# Patient Record
Sex: Male | Born: 1970
Health system: Southern US, Community
[De-identification: ages and names within clinical notes are randomized; demographics above are authoritative.]

## PROBLEM LIST (undated history)

## (undated) DIAGNOSIS — K219 Gastro-esophageal reflux disease without esophagitis: Secondary | ICD-10-CM

## (undated) DIAGNOSIS — IMO0002 Reserved for concepts with insufficient information to code with codable children: Secondary | ICD-10-CM

## (undated) DIAGNOSIS — G8929 Other chronic pain: Secondary | ICD-10-CM

## (undated) DIAGNOSIS — K921 Melena: Secondary | ICD-10-CM

## (undated) DIAGNOSIS — K279 Peptic ulcer, site unspecified, unspecified as acute or chronic, without hemorrhage or perforation: Secondary | ICD-10-CM

## (undated) DIAGNOSIS — M5090 Cervical disc disorder, unspecified, unspecified cervical region: Secondary | ICD-10-CM

## (undated) HISTORY — PX: HAND SURGERY: SHX662

## (undated) HISTORY — PX: WRIST SURGERY: SHX841

## (undated) HISTORY — DX: Cervical disc disorder, unspecified, unspecified cervical region: M50.90

## (undated) HISTORY — DX: Reserved for concepts with insufficient information to code with codable children: IMO0002

---

## 1998-10-29 HISTORY — PX: CERVICAL SPINE SURGERY: SHX589

## 1998-10-29 HISTORY — PX: NECK SURGERY: SHX720

## 1999-01-20 ENCOUNTER — Encounter: Payer: Self-pay | Admitting: *Deleted

## 1999-01-20 ENCOUNTER — Ambulatory Visit (HOSPITAL_COMMUNITY): Admission: RE | Admit: 1999-01-20 | Discharge: 1999-01-20 | Payer: Self-pay | Admitting: *Deleted

## 2000-07-09 ENCOUNTER — Encounter: Payer: Self-pay | Admitting: Neurosurgery

## 2000-07-09 ENCOUNTER — Ambulatory Visit (HOSPITAL_COMMUNITY): Admission: RE | Admit: 2000-07-09 | Discharge: 2000-07-09 | Payer: Self-pay | Admitting: Neurosurgery

## 2000-11-25 ENCOUNTER — Encounter: Payer: Self-pay | Admitting: Neurosurgery

## 2000-11-27 ENCOUNTER — Encounter: Payer: Self-pay | Admitting: Neurosurgery

## 2000-11-27 ENCOUNTER — Inpatient Hospital Stay (HOSPITAL_COMMUNITY): Admission: RE | Admit: 2000-11-27 | Discharge: 2000-11-28 | Payer: Self-pay | Admitting: Neurosurgery

## 2000-12-13 ENCOUNTER — Encounter: Payer: Self-pay | Admitting: Neurosurgery

## 2000-12-13 ENCOUNTER — Encounter: Admission: RE | Admit: 2000-12-13 | Discharge: 2000-12-13 | Payer: Self-pay | Admitting: Neurosurgery

## 2001-04-07 ENCOUNTER — Ambulatory Visit (HOSPITAL_COMMUNITY): Admission: RE | Admit: 2001-04-07 | Discharge: 2001-04-07 | Payer: Self-pay | Admitting: Neurosurgery

## 2001-04-07 ENCOUNTER — Encounter: Payer: Self-pay | Admitting: Neurosurgery

## 2002-12-13 ENCOUNTER — Encounter: Payer: Self-pay | Admitting: Neurosurgery

## 2002-12-13 ENCOUNTER — Ambulatory Visit (HOSPITAL_COMMUNITY): Admission: RE | Admit: 2002-12-13 | Discharge: 2002-12-13 | Payer: Self-pay | Admitting: Neurosurgery

## 2003-01-06 ENCOUNTER — Encounter: Payer: Self-pay | Admitting: Neurosurgery

## 2003-01-06 ENCOUNTER — Ambulatory Visit (HOSPITAL_COMMUNITY): Admission: RE | Admit: 2003-01-06 | Discharge: 2003-01-06 | Payer: Self-pay | Admitting: Neurosurgery

## 2003-01-15 ENCOUNTER — Encounter: Payer: Self-pay | Admitting: Neurosurgery

## 2003-01-15 ENCOUNTER — Inpatient Hospital Stay (HOSPITAL_COMMUNITY): Admission: RE | Admit: 2003-01-15 | Discharge: 2003-01-16 | Payer: Self-pay | Admitting: Neurosurgery

## 2003-04-26 ENCOUNTER — Encounter: Admission: RE | Admit: 2003-04-26 | Discharge: 2003-04-26 | Payer: Self-pay | Admitting: Family Medicine

## 2004-11-15 ENCOUNTER — Ambulatory Visit: Payer: Self-pay | Admitting: Family Medicine

## 2005-03-18 ENCOUNTER — Emergency Department (HOSPITAL_COMMUNITY): Admission: EM | Admit: 2005-03-18 | Discharge: 2005-03-18 | Payer: Self-pay | Admitting: Emergency Medicine

## 2005-03-30 ENCOUNTER — Emergency Department (HOSPITAL_COMMUNITY): Admission: EM | Admit: 2005-03-30 | Discharge: 2005-03-30 | Payer: Self-pay | Admitting: Emergency Medicine

## 2005-07-31 ENCOUNTER — Ambulatory Visit: Payer: Self-pay | Admitting: Family Medicine

## 2005-08-02 ENCOUNTER — Ambulatory Visit: Payer: Self-pay | Admitting: Family Medicine

## 2005-08-30 ENCOUNTER — Ambulatory Visit: Payer: Self-pay | Admitting: Family Medicine

## 2005-10-29 HISTORY — PX: CHOLECYSTECTOMY: SHX55

## 2006-09-12 ENCOUNTER — Ambulatory Visit: Payer: Self-pay | Admitting: Family Medicine

## 2006-10-29 DIAGNOSIS — IMO0002 Reserved for concepts with insufficient information to code with codable children: Secondary | ICD-10-CM

## 2006-10-29 DIAGNOSIS — K279 Peptic ulcer, site unspecified, unspecified as acute or chronic, without hemorrhage or perforation: Secondary | ICD-10-CM

## 2006-10-29 HISTORY — DX: Peptic ulcer, site unspecified, unspecified as acute or chronic, without hemorrhage or perforation: K27.9

## 2006-10-29 HISTORY — DX: Reserved for concepts with insufficient information to code with codable children: IMO0002

## 2006-10-30 ENCOUNTER — Ambulatory Visit: Payer: Self-pay | Admitting: Family Medicine

## 2006-12-26 DIAGNOSIS — F329 Major depressive disorder, single episode, unspecified: Secondary | ICD-10-CM

## 2007-01-07 ENCOUNTER — Ambulatory Visit: Payer: Self-pay | Admitting: Internal Medicine

## 2007-01-09 ENCOUNTER — Ambulatory Visit (HOSPITAL_COMMUNITY): Admission: RE | Admit: 2007-01-09 | Discharge: 2007-01-09 | Payer: Self-pay | Admitting: Internal Medicine

## 2007-01-10 ENCOUNTER — Ambulatory Visit: Payer: Self-pay | Admitting: *Deleted

## 2007-02-11 ENCOUNTER — Ambulatory Visit: Payer: Self-pay | Admitting: Internal Medicine

## 2007-08-01 DIAGNOSIS — R1011 Right upper quadrant pain: Secondary | ICD-10-CM

## 2007-08-01 DIAGNOSIS — F17200 Nicotine dependence, unspecified, uncomplicated: Secondary | ICD-10-CM | POA: Insufficient documentation

## 2007-08-01 DIAGNOSIS — M542 Cervicalgia: Secondary | ICD-10-CM

## 2007-08-01 DIAGNOSIS — M79609 Pain in unspecified limb: Secondary | ICD-10-CM

## 2007-08-01 DIAGNOSIS — K279 Peptic ulcer, site unspecified, unspecified as acute or chronic, without hemorrhage or perforation: Secondary | ICD-10-CM | POA: Insufficient documentation

## 2007-08-12 ENCOUNTER — Ambulatory Visit: Payer: Self-pay | Admitting: Internal Medicine

## 2007-08-15 ENCOUNTER — Ambulatory Visit: Payer: Self-pay | Admitting: Internal Medicine

## 2007-09-02 ENCOUNTER — Encounter: Admission: RE | Admit: 2007-09-02 | Discharge: 2007-09-02 | Payer: Self-pay | Admitting: General Surgery

## 2007-09-18 ENCOUNTER — Encounter (INDEPENDENT_AMBULATORY_CARE_PROVIDER_SITE_OTHER): Payer: Self-pay | Admitting: General Surgery

## 2007-09-18 ENCOUNTER — Ambulatory Visit (HOSPITAL_COMMUNITY): Admission: RE | Admit: 2007-09-18 | Discharge: 2007-09-18 | Payer: Self-pay | Admitting: General Surgery

## 2007-11-28 ENCOUNTER — Telehealth (INDEPENDENT_AMBULATORY_CARE_PROVIDER_SITE_OTHER): Payer: Self-pay | Admitting: Internal Medicine

## 2008-01-08 ENCOUNTER — Ambulatory Visit: Payer: Self-pay | Admitting: Internal Medicine

## 2008-02-10 ENCOUNTER — Telehealth (INDEPENDENT_AMBULATORY_CARE_PROVIDER_SITE_OTHER): Payer: Self-pay | Admitting: Internal Medicine

## 2008-03-08 ENCOUNTER — Telehealth (INDEPENDENT_AMBULATORY_CARE_PROVIDER_SITE_OTHER): Payer: Self-pay | Admitting: Internal Medicine

## 2008-03-16 ENCOUNTER — Ambulatory Visit: Payer: Self-pay | Admitting: Internal Medicine

## 2008-03-16 DIAGNOSIS — M25519 Pain in unspecified shoulder: Secondary | ICD-10-CM | POA: Insufficient documentation

## 2008-03-31 ENCOUNTER — Encounter: Admission: RE | Admit: 2008-03-31 | Discharge: 2008-05-03 | Payer: Self-pay | Admitting: Internal Medicine

## 2008-03-31 ENCOUNTER — Encounter (INDEPENDENT_AMBULATORY_CARE_PROVIDER_SITE_OTHER): Payer: Self-pay | Admitting: Internal Medicine

## 2008-04-06 ENCOUNTER — Ambulatory Visit (HOSPITAL_COMMUNITY): Admission: RE | Admit: 2008-04-06 | Discharge: 2008-04-06 | Payer: Self-pay | Admitting: Internal Medicine

## 2008-04-29 ENCOUNTER — Encounter (INDEPENDENT_AMBULATORY_CARE_PROVIDER_SITE_OTHER): Payer: Self-pay | Admitting: Internal Medicine

## 2008-05-27 ENCOUNTER — Ambulatory Visit: Payer: Self-pay | Admitting: Internal Medicine

## 2008-09-06 ENCOUNTER — Telehealth (INDEPENDENT_AMBULATORY_CARE_PROVIDER_SITE_OTHER): Payer: Self-pay | Admitting: Family Medicine

## 2008-09-07 ENCOUNTER — Ambulatory Visit: Payer: Self-pay | Admitting: Family Medicine

## 2008-09-07 DIAGNOSIS — S2341XA Sprain of ribs, initial encounter: Secondary | ICD-10-CM

## 2008-09-21 ENCOUNTER — Telehealth (INDEPENDENT_AMBULATORY_CARE_PROVIDER_SITE_OTHER): Payer: Self-pay | Admitting: Internal Medicine

## 2008-12-24 ENCOUNTER — Telehealth (INDEPENDENT_AMBULATORY_CARE_PROVIDER_SITE_OTHER): Payer: Self-pay | Admitting: Internal Medicine

## 2009-01-21 ENCOUNTER — Telehealth (INDEPENDENT_AMBULATORY_CARE_PROVIDER_SITE_OTHER): Payer: Self-pay | Admitting: Internal Medicine

## 2009-07-01 ENCOUNTER — Telehealth (INDEPENDENT_AMBULATORY_CARE_PROVIDER_SITE_OTHER): Payer: Self-pay | Admitting: Internal Medicine

## 2009-07-28 ENCOUNTER — Ambulatory Visit: Payer: Self-pay | Admitting: Internal Medicine

## 2009-10-07 ENCOUNTER — Telehealth (INDEPENDENT_AMBULATORY_CARE_PROVIDER_SITE_OTHER): Payer: Self-pay | Admitting: Internal Medicine

## 2009-10-11 ENCOUNTER — Telehealth (INDEPENDENT_AMBULATORY_CARE_PROVIDER_SITE_OTHER): Payer: Self-pay | Admitting: Internal Medicine

## 2009-11-17 ENCOUNTER — Ambulatory Visit: Payer: Self-pay | Admitting: Internal Medicine

## 2010-01-19 ENCOUNTER — Ambulatory Visit: Payer: Self-pay | Admitting: Internal Medicine

## 2010-04-19 ENCOUNTER — Telehealth (INDEPENDENT_AMBULATORY_CARE_PROVIDER_SITE_OTHER): Payer: Self-pay | Admitting: Internal Medicine

## 2010-05-10 ENCOUNTER — Telehealth (INDEPENDENT_AMBULATORY_CARE_PROVIDER_SITE_OTHER): Payer: Self-pay | Admitting: Internal Medicine

## 2010-07-18 ENCOUNTER — Ambulatory Visit: Payer: Self-pay | Admitting: Internal Medicine

## 2010-07-18 LAB — CONVERTED CEMR LAB
Bilirubin Urine: NEGATIVE
Glucose, Urine, Semiquant: NEGATIVE
Protein, U semiquant: NEGATIVE
Specific Gravity, Urine: <1.005

## 2010-08-01 ENCOUNTER — Telehealth (INDEPENDENT_AMBULATORY_CARE_PROVIDER_SITE_OTHER): Payer: Self-pay | Admitting: Internal Medicine

## 2010-08-01 ENCOUNTER — Ambulatory Visit: Payer: Self-pay | Admitting: Internal Medicine

## 2010-08-01 LAB — CONVERTED CEMR LAB
ALT: 28 units/L (ref 0–53)
AST: 20 units/L (ref 0–37)
Alkaline Phosphatase: 69 units/L (ref 39–117)
Basophils Relative: 1 % (ref 0–1)
Calcium: 9.7 mg/dL (ref 8.4–10.5)
Cholesterol: 155 mg/dL (ref 0–200)
Creatinine, Ser: 1.09 mg/dL (ref 0.40–1.50)
HCT: 47 % (ref 39.0–52.0)
Hemoglobin: 15.4 g/dL (ref 13.0–17.0)
MCHC: 32.8 g/dL (ref 30.0–36.0)
MCV: 91.8 fL (ref 78.0–100.0)
Neutro Abs: 3 10*3/uL (ref 1.7–7.7)
Neutrophils Relative %: 55 % (ref 43–77)
Total Bilirubin: 0.9 mg/dL (ref 0.3–1.2)
VLDL: 21 mg/dL (ref 0–40)
WBC: 5.4 10*3/uL (ref 4.0–10.5)

## 2010-08-11 ENCOUNTER — Telehealth (INDEPENDENT_AMBULATORY_CARE_PROVIDER_SITE_OTHER): Payer: Self-pay | Admitting: Internal Medicine

## 2010-08-25 ENCOUNTER — Encounter (INDEPENDENT_AMBULATORY_CARE_PROVIDER_SITE_OTHER): Payer: Self-pay | Admitting: Internal Medicine

## 2010-11-09 ENCOUNTER — Telehealth (INDEPENDENT_AMBULATORY_CARE_PROVIDER_SITE_OTHER): Payer: Self-pay | Admitting: Internal Medicine

## 2010-11-14 ENCOUNTER — Telehealth (INDEPENDENT_AMBULATORY_CARE_PROVIDER_SITE_OTHER): Payer: Self-pay | Admitting: Internal Medicine

## 2010-11-28 NOTE — Progress Notes (Signed)
Summary: Vicodin refill  Phone Note Call from Patient Call back at Home Phone [phone]   Reason for Call: Refill Medication Summary of Call: Zachary Berg PT. Zachary Berg CALLED AND SAYS THAT [facility]CE[facility] REFILL REQUEST ON HIS VICODEN. Initial call taken by: Leodis Rains,  April 19, 2010 9:27 AM  Follow-up for Phone Call        He last got #60 on 03/13/10. Follow-up by: Vesta Mixer CMA,  April 19, 2010 12:52 PM  Additional Follow-up for Phone Call Additional follow up Details #1::        Let him know faxed tomorrow Additional Follow-up by: Julieanne Manson MD,  April 19, 2010 7:09 PM    Additional Follow-up for Phone Call Additional follow up Details #2::    No current number for pt. Follow-up by: Vesta Mixer CMA,  April 20, 2010 3:21 PM  Prescriptions: VICODIN 5-500 MG TABS (HYDROCODONE-ACETAMINOPHEN) One tablet by mouth every 6 hours as needed for pain  #60 x 0   Entered and Authorized by:   Julieanne Manson MD   Signed by:   Julieanne Manson MD on 04/19/2010   Method used:   Printed then faxed to ...       [facility]  [address] [phone][facility]ail[facility]       [address]       [place], [place]  [address]       Ph: [phone] or [phone]       Fax: [phone]   RxID:   [id number]

## 2010-11-28 NOTE — Letter (Signed)
Summary: AGREEMENT FOR CONTROLLED SUBSTANCE  AGREEMENT FOR CONTROLLED SUBSTANCE   Imported By: Arta Bruce 07/18/2010 15:07:11  _____________________________________________________________________  External Attachment:    Type:   Image     Comment:   External Document

## 2010-11-28 NOTE — Assessment & Plan Note (Signed)
Summary: 2 MONTH FOLLOWUP///BC   Vital Signs:  Patient profile:   40 year old male Weight:      146.56 pounds Temp:     97.5 degrees F Pulse rate:   59 / minute Pulse rhythm:   regular Resp:     16 per minute BP sitting:   134 / 79  (left arm) Cuff size:   regular  Vitals Entered By: Chauncy Passy, SMA CC: Pt. is here for a 2 mont f/u on cessation of smoking. Per pt., it is going well and has not smoked since last visit.  Is Patient Diabetic? No Pain Assessment Patient in pain? no       Does patient need assistance? Ambulation Normal   CC:  Pt. is here for a 2 mont f/u on cessation of smoking. Per pt. and it is going well and has not smoked since last visit. Marland Kitchen  History of Present Illness: 1.  Tobacco Abuse:  On last 4 weeks of 12 week course of Chantix.  No longer smoking.  Wife has quit as well.  Feels well.  Can taste food better.  Feels he is breathing better.  No cough now.  Is having cravings for nicotine now--maybe more than when first stopped.  Trying to keep busy with physical activity.    Current Medications (verified): 1)  Vicodin 5-500 Mg Tabs (Hydrocodone-Acetaminophen) .... One Tablet By Mouth Every 6 Hours As Needed For Pain 2)  Prilosec 40 Mg  Cpdr (Omeprazole) .Marland Kitchen.. 1 By Mouth Two Times A Day As Needed 3)  Chantix Starting Month Pak 0.5 Mg X 11 & 1 Mg X 42 Tabs (Varenicline Tartrate) .... 0.5 Mg Tab By Mouth Daily For 3 Days, Then Increase To Two Times A Day For 4 Days, Then Increase To 1 Mg Tab By Mouth Two Times A Day Thereafter.  Stop Smoking On Day #8 4)  Chantix 1 Mg Tabs (Varenicline Tartrate) .Marland Kitchen.. 1 Tab By Mouth Two Times A Day  Allergies (verified): No Known Drug Allergies  Physical Exam  Lungs:  Normal respiratory effort, chest expands symmetrically. Lungs are clear to auscultation, no crackles or wheezes. Heart:  Normal rate and regular rhythm. S1 and S2 normal without gallop, murmur, click, rub or other extra sounds.   Impression &  Recommendations:  Problem # 1:  TOBACCO ABUSE (ICD-305.1) Doing well--will extend out use of Chantix with current cravings--may wean or stop as he feels he can in next 3 months His updated medication list for this problem includes:    Chantix Starting Month Pak 0.5 Mg X 11 & 1 Mg X 42 Tabs (Varenicline tartrate) .Marland Kitchen... 0.5 mg tab by mouth daily for 3 days, then increase to two times a day for 4 days, then increase to 1 mg tab by mouth two times a day thereafter.  stop smoking on day #8    Chantix 1 Mg Tabs (Varenicline tartrate) .Marland Kitchen... 1 tab by mouth two times a day  Complete Medication List: 1)  Vicodin 5-500 Mg Tabs (Hydrocodone-acetaminophen) .... One tablet by mouth every 6 hours as needed for pain 2)  Prilosec 40 Mg Cpdr (Omeprazole) .Marland Kitchen.. 1 by mouth two times a day as needed 3)  Chantix Starting Month Pak 0.5 Mg X 11 & 1 Mg X 42 Tabs (Varenicline tartrate) .... 0.5 mg tab by mouth daily for 3 days, then increase to two times a day for 4 days, then increase to 1 mg tab by mouth two times a day thereafter.  stop smoking on day #8 4)  Chantix 1 Mg Tabs (Varenicline tartrate) .Marland Kitchen.. 1 tab by mouth two times a day  Patient Instructions: 1)  CPE in next 6 months with Dr. Delrae Alfred Prescriptions: CHANTIX 1 MG TABS (VARENICLINE TARTRATE) 1 tab by mouth two times a day  #60 x 2   Entered and Authorized by:   Julieanne Manson MD   Signed by:   Julieanne Manson MD on 01/19/2010   Method used:   Faxed to ...       [facility] (retail)       [address] [place], [place]  [address]       Ph: [phone]       Fax: [phone]   RxID:   [id number]

## 2010-11-28 NOTE — Progress Notes (Signed)
Summary: REFILL ON HYDROCODONE  Phone Note Call from Patient Call back at Home Phone [phone]   Reason for Call: Refill Medication Summary of Call: Kristoff Coonradt PT. [facility] Nicholes [facility]ED [facility]   [place] [facility]FI[facility] ON HIS HYDROCODONE. [facility]ES [facility] [place]. Initial call taken by: Leodis Rains,  August 11, 2010 3:48 PM  Follow-up for Phone Call        Due today--called into pharmacy Follow-up by: Julieanne Manson MD,  August 15, 2010 8:34 AM    Prescriptions: VICODIN 5-500 MG TABS (HYDROCODONE-ACETAMINOPHEN) One tablet by mouth every 6 hours as needed for pain  #60 x 0   Entered by:   Julieanne Manson MD   Authorized by:   Dutch Quint RN   Signed by:   Julieanne Manson MD on 08/15/2010   Method used:   Telephoned to ...       [facility]  [address] [phone]* (retail[facility]       [address]       [place], [place]  [address]       Ph: [phone] or [phone]       Fax: [phone]   RxID:   [id number]

## 2010-11-28 NOTE — Progress Notes (Signed)
Summary: REFILL ON VICODEN  Phone Note Call from Patient Call back at Home Phone [phone]   Reason for Call: Refill Medication Summary of Call: Leiliana Foody PT. MR Zacher CALLED AND SAYS THAT HE NEEDS ANOTHER REFILL ON HIS VICODEN. [facility] [place]. HE IS LEAVING GO[place]. Initial call taken by: Leodis Rains,  May 10, 2010 5:00PM  Follow-up for Phone Call        Received fax from pharmacy pt last filled 04/20/2010 The earliest pt can get medication is 05/19/2010 (next Friday) Follow-up by: Lehman Prom FNP,  May 11, 2010 8:09 AM  Additional Follow-up for Phone Call Additional follow up Details #1::        Last month's refill was late--will fill today Additional Follow-up by: Julieanne Manson MD,  May 12, 2010 8:23 AM    Prescriptions: VICODIN 5-500 MG TABS (HYDROCODONE-ACETAMINOPHEN) One tablet by mouth every 6 hours as needed for pain  #60 x 0   Entered by:   Julieanne Manson MD   Authorized by:   Vesta Mixer CMA   Signed by:   Julieanne Manson MD on 05/12/2010   Method used:   Printed then faxed to ...       [facility]  [address] [phone][facility] ([facility]ail)       [address] [place]  [address]       Ph: [phone] or [phone]       Fax: [phone]   RxID:   [id number]   Appended Document: REFILL ON VICODEN    Clinical Lists Changes  Medications: Rx of VICODIN 5-500 MG TABS (HYDROCODONE-ACETAMINOPHEN) One tablet by mouth every 6 hours as needed for pain;  #60 x 0;  Signed;  Entered by: Julieanne Manson MD;  Authorized by: Julieanne Manson MD;  Method used: [facility]  [address] [phone] [address] [place], [place], [place]  [address], Ph: [phone] or [phone], Fax: [phone]    Prescriptions: VICODIN 5-500 MG TABS (HYDROCODONE-ACETAMINOPHEN) One tablet by mouth every 6 hours as needed for pain  #60 x 0   Entered and Authorized by:   Julieanne Manson MD   Signed by:    Julieanne Manson MD on 05/12/2010   Method used:   Printed then faxed to ...       [facility]  [address] [phone][facility] ([facility])       [address]       [place], [place]  [address]       Ph: [phone] or [phone]       Fax: [phone]   RxID:   [id number]  WRong name on Rx--reprinted

## 2010-11-28 NOTE — Progress Notes (Signed)
Summary: Office Visit//depression screening  Office Visit//depression screening   Imported By: Arta Bruce 07/18/2010 12:35:23  _____________________________________________________________________  External Attachment:    Type:   Image     Comment:   External Document
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
## 2010-11-28 NOTE — Letter (Signed)
Summary: Lipid Letter  [facility]  [address] [place], [place] [address]   Phone: [phone]  Fax: [phone]    08/25/2010  Zachary Berg [address] [place], [place]  [address]  Dear [name]:  We have carefully reviewed your last lipid profile from 08/01/2010 and the results are noted below with a summary of recommendations for lipid management.    Cholesterol:       155     Goal: <200   HDL "good" Cholesterol:   56     Goal: >45   LDL "bad" Cholesterol:   78     Goal: <100   Triglycerides:       107     Goal: <150    Cholesterol  and rest of labs excellent.    TLC Diet (Therapeutic Lifestyle Change): Saturated Fats & Transfatty acids should be kept < 7% of total calories ***Reduce Saturated Fats Polyunstaurated Fat can be up to 10% of total calories Monounsaturated Fat Fat can be up to 20% of total calories Total Fat should be no greater than 25-35% of total calories Carbohydrates should be 50-60% of total calories Protein should be approximately 15% of total calories Fiber should be at least 20-30 grams a day ***Increased fiber may help lower LDL Total Cholesterol should be < 200mg /day Consider adding plant stanol/sterols to diet (example: Benacol spread) ***A higher intake of unsaturated fat may reduce Triglycerides and Increase HDL    Adjunctive Measures (may lower LIPIDS and reduce risk of Heart Attack) include: Aerobic Exercise (20-30 minutes 3-4 times a week) Limit Alcohol Consumption Weight Reduction Aspirin 75-81 mg a day by mouth (if not allergic or contraindicated) Dietary Fiber 20-30 grams a day by mouth     Current Medications: 1)    Vicodin 5-500 Mg Tabs (Hydrocodone-acetaminophen) .... One tablet by mouth every 6 hours as needed for pain 2)    Prilosec 40 Mg  Cpdr (Omeprazole) .Marland Kitchen.. 1 by mouth two times a day as needed  If you have any questions, please call. We appreciate being able to work with you.   Sincerely,     [facility] Julieanne Manson MD

## 2010-11-28 NOTE — Assessment & Plan Note (Signed)
Summary: 3 MONTH FU FROM SEPT//KT   Vital Signs:  Patient profile:   40 year old male Weight:      141.8 pounds Temp:     97.6 degrees F oral Pulse rate:   59 / minute Pulse rhythm:   regular Resp:     18 per minute BP sitting:   127 / 84  (left arm) Cuff size:   regular  Vitals Entered By: Geanie Cooley (November 17, 2009 12:22 PM) CC: pt here for followup from sept. Is Patient Diabetic? No Pain Assessment Patient in pain? no       Does patient need assistance? Functional Status Self care Ambulation Normal   CC:  pt here for followup from sept.Marland Kitchen  History of Present Illness: 1.  Smoking cessation:  Finally started Chantix 1 1/2 weeks ago.  Has not stopped smoking as directed on day 8.  Tolerating well so far.  2.  Chronic Neck Pain:  still requring narcotics, but has not needed increase over some time.  No change in his symptoms with tingling in hands bilaterally  3.  Right groin pain:  infrequent.  Has not enlarged.  States surgery said it was not a hernia when seen in 2009, but to call if lump gets bigger.  Allergies (verified): No Known Drug Allergies  Physical Exam  Lungs:  Normal respiratory effort, chest expands symmetrically. Lungs are clear to auscultation, no crackles or wheezes. Heart:  Normal rate and regular rhythm. S1 and S2 normal without gallop, murmur, click, rub or other extra sounds.   Impression & Recommendations:  Problem # 1:  TOBACCO ABUSE (ICD-305.1) Just getting started Needs to stop smoking now--discussed he needs to be motivated, the pill will not make him stop. To call if any problems with mood. His updated medication list for this problem includes:    Chantix Starting Month Pak 0.5 Mg X 11 & 1 Mg X 42 Tabs (Varenicline tartrate) .Marland Kitchen... 0.5 mg tab by mouth daily for 3 days, then increase to two times a day for 4 days, then increase to 1 mg tab by mouth two times a day thereafter.  stop smoking on day #8    Chantix 1 Mg Tabs  (Varenicline tartrate) .Marland Kitchen... 1 tab by mouth two times a day  Problem # 2:  NECK PAIN, CHRONIC (ICD-723.1)  His updated medication list for this problem includes:    Vicodin 5-500 Mg Tabs (Hydrocodone-acetaminophen) .Marland Kitchen... 1 tablet by mouth every six hours  Problem # 3:  POSSIBLE RIGHT INGUINAL HERNIA (ICD-550.90) Need to get surgical papers  Complete Medication List: 1)  Vicodin 5-500 Mg Tabs (Hydrocodone-acetaminophen) .Marland Kitchen.. 1 tablet by mouth every six hours 2)  Prilosec 40 Mg Cpdr (Omeprazole) .Marland Kitchen.. 1 by mouth two times a day as needed 3)  Chantix Starting Month Pak 0.5 Mg X 11 & 1 Mg X 42 Tabs (Varenicline tartrate) .... 0.5 mg tab by mouth daily for 3 days, then increase to two times a day for 4 days, then increase to 1 mg tab by mouth two times a day thereafter.  stop smoking on day #8 4)  Chantix 1 Mg Tabs (Varenicline tartrate) .Marland Kitchen.. 1 tab by mouth two times a day  Patient Instructions: 1)  Follow up with Dr. Delrae Alfred in 2 months --smoking cessation.   Vital Signs:  Patient profile:   40 year old male Weight:      141.8 pounds Temp:     97.6 degrees F oral Pulse rate:  59 / minute Pulse rhythm:   regular Resp:     18 per minute BP sitting:   127 / 84  (left arm) Cuff size:   regular  Vitals Entered By: Geanie Cooley (November 17, 2009 12:22 PM)

## 2010-11-30 NOTE — Progress Notes (Signed)
Summary: Needs vaccines prior to traveling  Phone Note Call from Patient   Summary of Call: pt is going to [place] on a mission trip in March and he knows he needs tetanus shot but he does not know the other shots he might need and he is wondering if you are able to tell him.... pt would like to know if he can get a tetanus shot here Initial call taken by: Armenia Shannon,  November 09, 2010 10:15 AM  Follow-up for Phone Call        Per immunization record, last received Td 2003.  Dutch Quint RN  November 09, 2010 10:32 AM   Additional Follow-up for Phone Call Additional follow up Details #1::        I can look it up, but he can as well.  I would actually encourage him to call the PHD and make an appt. as they will be the only ones able to give any of the required vaccines. If he gets on the [facility] website for travel and immunization, he can check the areas of the country he will be going to and see if there are recommendations for vaccines. Additional Follow-up by: Julieanne Manson MD,  November 14, 2010 2:34 PM    Additional Follow-up for Phone Call Additional follow up Details #2::    Pt. advised of provider's response and recommendations - verbalized understanding and agreement.  Dutch Quint RN  November 14, 2010 2:51 PM

## 2010-11-30 NOTE — Progress Notes (Signed)
Summary: Vicodin  Phone Note Refill Request   Refills Requested: Medication #1:  VICODIN 5-500 MG TABS One tablet by mouth every 6 hours as needed for pain [facility] [address]  Initial call taken by: Armenia Shannon,  November 14, 2010 2:49 PM    Prescriptions: VICODIN 5-500 MG TABS (HYDROCODONE-ACETAMINOPHEN) One tablet by mouth every 6 hours as needed for pain  #60 x 0   Entered and Authorized by:   Julieanne Manson MD   Signed by:   Julieanne Manson MD on 11/15/2010   Method used:   Printed then faxed to ...       [facility]  [address] [phone][facility] (retail)       [address]       [place], [place]  [address]       Ph: [phone] or [phone]       Fax: [phone]   RxID:   [id number]

## 2010-12-14 ENCOUNTER — Telehealth (INDEPENDENT_AMBULATORY_CARE_PROVIDER_SITE_OTHER): Payer: Self-pay | Admitting: Internal Medicine

## 2011-01-09 NOTE — Progress Notes (Signed)
Summary: HYDROCODONE  Phone Note Call from Patient Call back at Home Phone [phone]   Reason for Call: Refill Medication Summary of Call: Layah Skousen PT. MR Sima SAYS THAT HE CALLED [facility] [address] FOR [facility] REFILL REQUEST ON HIS HYDROCODONE, BUT SO FAR WE HAVEN'T RECEIVED THE REQUEST. Initial call taken by: Leodis Rains,  December 14, 2010 11:14 AM  Follow-up for Phone Call        received and refilled advise pt to check with the pharmacy Follow-up by: Lehman Prom FNP,  December 18, 2010 9:02 AM  Additional Follow-up for Phone Call Additional follow up Details #1::        Per pharmacy, pt. has already picked up Rx.  Dutch Quint RN  January 01, 2011 8:59 AM     New/Updated Medications: VICODIN 5-500 MG TABS (HYDROCODONE-ACETAMINOPHEN) One tablet by mouth two times a day as needed for pain Prescriptions: VICODIN 5-500 MG TABS (HYDROCODONE-ACETAMINOPHEN) One tablet by mouth two times a day as needed for pain  #60 x 0   Entered and Authorized by:   Lehman Prom FNP   Signed by:   Lehman Prom FNP on 12/18/2010   Method used:   Printed then faxed to ...       [facility]  [address] [phone][facility] (retail)       [address]       [place], [place]  [address]       Ph: [phone] or [phone]       Fax: [phone]   RxID:   [id number]
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
## 2011-03-16 NOTE — H&P (Signed)
NAME:  WARREN, LINDAHL                            ACCOUNT NO.:  [id number]   MEDICAL RECORD NO.:  [id number]                   PATIENT TYPE:  INP   LOCATION:  3035                                 FACILITY:  [facility]   PHYSICIAN:  Payton Doughty, M.D.                   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   HISTORY OF PRESENT ILLNESS:  This is a now 40 year old right-handed white  gentleman who in 2002 underwent an anterior cervical diskectomy and fusion  at C5-6.  Postoperatively, he did reasonably well.  He has developed  increasing pain in his neck and now in his arm.  Plain films demonstrate a  nonunion at C5-6 and he is admitted for stabilization posteriorly.   PAST MEDICAL HISTORY:  Unremarkable for significant diseases.  He has had a  tendon injury in the left wrist as a child.   PAST SURGICAL HISTORY:  1. Tonsillectomy.  2. Anterior cervical.   ALLERGIES:  None.   MEDICATIONS:  Darvocet on a p.r.n. basis.   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   FAMILY HISTORY:  Mom is 74 and in good health, dad is 71 in fair health with  heart disease.   REVIEW OF SYSTEMS:  Remarkable for arm weakness, leg weakness, back pain,  arm pain, joint pain and swelling, and neck pain.   PHYSICAL EXAMINATION:  HEENT:  Within normal limits.  NECK:  He does not have great range of motion in his neck, particularly when  it pops and cracks in towards turning to the right then to the left.  CHEST:  Some crackles but is basically clear.  CARDIAC:  Regular rate and rhythm without a murmur.  ABDOMEN:  Nontender.  No hepatosplenomegaly.  EXTREMITIES:  Without clubbing or cyanosis.  GENITOURINARY:  Exam is deferred.  Peripheral pulses are good.  NEUROLOGIC:  He is awake, alert, and oriented.   Cranial nerves are intact.  Motor exam demonstrates 5/5 strength throughout the upper and lower  extremities.  He has a mild left C7 sensory deficit.  Reflexes are 2 at the  biceps, 1 at the triceps, 1 at the brachioradialis bilaterally.  Lower  extremities do not demonstrate any myelopathy and the Hoffmann's is  negative.   LABORATORY DATA:  His films as noted above showed some probable nonunion at  C5-6.  There is no other disk disease evident.   CLINICAL IMPRESSION:  Nonunion at 5-6.   PLAN:  The plan is for posterior cervical stabilization.  The risks and  benefits of this approach have been discussed with him and he wishes to  proceed.  Payton Doughty, M.D.    MWR/MEDQ  D:  01/15/2003  T:  01/16/2003  Job:  [id number]

## 2011-03-16 NOTE — Op Note (Signed)
   NAME:  Zachary Berg, Zachary Berg                            ACCOUNT NO.:  [id number]   MEDICAL RECORD NO.:  [id number]                   PATIENT TYPE:  INP   LOCATION:  3035                                 FACILITY:  [facility]   PHYSICIAN:  Payton Doughty, M.D.                   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   OPERATIVE PROCEDURE:  C5-6 posterior cervical fusion with vertex screws.   SURGEON:  Payton Doughty, M.D.   ANESTHESIA:  General endotracheal.   PREPARATION:  __________ with alcohol wipe.   COMPLICATIONS:  None.   ASSISTANT:  Nurse assistant - [place].  Doctor assistant - Venetia Maxon.  __________.   A 40 year old gentleman with a failed fusion anteriorly.  He was taken to  the operating room, anesthetized, intubated, and placed prone on the  operating table in the [place] head holder.  Following shave, prep, and  drape in the usual sterile fashion, the skin was infiltrated with 1%  lidocaine with 1:400,000 epinephrine.  Skin was incised from the mid C6 to  mid C4 and the lamina of C4, C5, and C6 were exposed bilaterally.  In the  subperiosteal plane out over the facet joints, intraoperative x-ray  confirmed crack at this level.  Screws were then placed in the lateral  masses of C5 and C6.  Intraoperative x-ray showed good placement of the  screws.  Facet joints were decorticated and bone graft placed within them.  The connecting rods were then placed between the screws and the caps  tightened.  Following tightening of the caps, the remnant of C5 and C6 were  decorticated manually and covered with combination of DBX and bone croutons.  The fascia was then reapproximated with 0 Vicryl in interrupted fashion.  Subcutaneous tissue was reapproximated  with 0 Vicryl in interrupted fashion,  subcuticular tissue reapproximated with 3-0 Vicryl in interrupted fashion.  Skin was closed with 3-0 nylon in running locked fashion.  Betadine and  Telfa dressings applied and made occlusive with Op-Site.  The patient  returned to the recovery room in good condition.                                                 Payton Doughty, M.D.    MWR/MEDQ  D:  01/15/2003  T:  01/17/2003  Job:  [id number]

## 2011-03-16 NOTE — Op Note (Signed)
[place]. [facility]  Patient:    Zachary Berg, Zachary Berg                         MRN: [id number] Proc. Date: 11/27/00 Adm. 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  OPERATION PERFORMED:  C5-C6 anterior cervical diskectomy and fusion with a tether plate.  SURGEON:  Payton Doughty, M.D.  ANESTHESIA:  General endotracheal.  PREP:  Sterile Betadine prep and scrub with alcohol wipe.  COMPLICATIONS:  None.  DESCRIPTION OF PROCEDURE:  This 40 year old right-handed white male with right C6 radiculopathy.  The patient was taken to the operating room, smoothly anesthetized and intubated, and placed supine on the operating table in halter head traction with the neck extended.  Following shave, prep and drape in the usual sterile fashion, the skin was incised in the midline.  The medial border of the sternocleidomastoid muscle on the left side.  The platysma was identified, elevated, divided and undermined.  Medial dissection of the sternocleidomastoid revealed the carotid arteries tracked laterally to the left.  Trachea and esophagus, retracted laterally to the right exposing the bones of the anterior cervical spine.  Marker was placed and intraoperative x-ray obtained to confirm the correctness of the level.  Having confirmed correctness of the level.  Diskectomy was carried out at C5-C6 first using gross observation and then using the operating microscope.  Under the operating microscope, the anterior epidural space was dissected and the posterior longitudinal ligament removed.  There was disk material out into the right C6 neural foramen and this was removed without difficulty.  The neural foramen was carefully explored and found to be wide open.  On the left side similar exploration was carried out but there was no  herniated disk on that side.  Following complete resection of the disk, a 7 mm bone graft was fashioned from patellar allograft and tapped into place.  A 14 mm tether plate was then placed with 12 mm screws.  Intraoperative x-ray showed good placement of bone graft, plate and screws.  The wound was irrigated and hemostasis assured.  The platysma was reapproximated with 3-0 Vicryl in interrupted fashion, subcutaneous tissues reapproximated with 3-0 Vicryl in interrupted fashion.  The skin was closed with 4-0 Vicryl in running subcuticular fashion. Benzoin and Steri-Strips were placed and made occlusive with Telfa and Op-Site.  The patient then placed in an Aspen collar and returned to the recovery room in good condition. DD:  11/27/00 TD:  11/27/00 Job: [id number]
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
## 2011-08-07 LAB — COMPREHENSIVE METABOLIC PANEL
ALT: 16
AST: 18
Albumin: 3.8
Alkaline Phosphatase: 66
Calcium: 9.2
Chloride: 107
Creatinine, Ser: 1.01
GFR calc Af Amer: >60
Potassium: 4.3
Sodium: 139
Total Bilirubin: 0.7
Total Protein: 6.3

## 2011-08-07 LAB — CBC
Hemoglobin: 15.1
RBC: 4.72
WBC: 6.9

## 2011-08-07 LAB — DIFFERENTIAL
Basophils Relative: 1
Eosinophils Absolute: 0.2
Lymphocytes Relative: 28
Lymphs Abs: 1.9
Neutrophils Relative %: 58

## 2011-12-24 ENCOUNTER — Ambulatory Visit (HOSPITAL_BASED_OUTPATIENT_CLINIC_OR_DEPARTMENT_OTHER)
Admission: RE | Admit: 2011-12-24 | Discharge: 2011-12-24 | Disposition: A | Discharge status: Home / Self Care | Payer: 59 | Source: Ambulatory Visit | Attending: Family | Admitting: Family

## 2011-12-24 ENCOUNTER — Ambulatory Visit (INDEPENDENT_AMBULATORY_CARE_PROVIDER_SITE_OTHER): Payer: 59 | Admitting: Family

## 2011-12-24 ENCOUNTER — Telehealth: Payer: Self-pay | Admitting: Family

## 2011-12-24 ENCOUNTER — Encounter: Payer: Self-pay | Admitting: Family

## 2011-12-24 VITALS — BP 116/74 | HR 64 | Temp 97.9°F | Resp 16 | Ht 68.5 in | Wt 154.1 lb

## 2011-12-24 DIAGNOSIS — M542 Cervicalgia: Secondary | ICD-10-CM

## 2011-12-24 DIAGNOSIS — M25551 Pain in right hip: Secondary | ICD-10-CM

## 2011-12-24 DIAGNOSIS — F17200 Nicotine dependence, unspecified, uncomplicated: Secondary | ICD-10-CM

## 2011-12-24 DIAGNOSIS — K279 Peptic ulcer, site unspecified, unspecified as acute or chronic, without hemorrhage or perforation: Secondary | ICD-10-CM

## 2011-12-24 DIAGNOSIS — M25559 Pain in unspecified hip: Secondary | ICD-10-CM

## 2011-12-24 MED ORDER — HYDROCODONE-ACETAMINOPHEN 5-500 MG PO TABS: ORAL_TABLET | ORAL | Status: DC

## 2011-12-24 MED ORDER — MELOXICAM 7.5 MG PO TABS: 7.5000 mg | ORAL_TABLET | Freq: Every day | ORAL | Status: DC

## 2011-12-24 NOTE — Telephone Encounter (Signed)
Pls let pt know that his hip x-ray is normal.  Hopefully he will see some improvement with the meloxicam.  He should let us know if symptoms worsen, or if no improvement in the next few weeks.

## 2011-12-24 NOTE — Telephone Encounter (Signed)
Left detailed message on cell voicemail and to call if any questions. 

## 2011-12-24 NOTE — Assessment & Plan Note (Signed)
Rx was provided for vicodin today after pt signed a controlled substance contract.

## 2011-12-24 NOTE — Assessment & Plan Note (Signed)
He reports that he quit 2 yrs ago.

## 2011-12-24 NOTE — Assessment & Plan Note (Signed)
Clinically resolved.  Continue PPI.

## 2011-12-24 NOTE — Patient Instructions (Signed)
Please complete your x-ray on the first floor. Follow up in 1 month for a complete physical- come fasting to this appointment.  Welcome to [facility]!

## 2011-12-24 NOTE — Assessment & Plan Note (Signed)
Will obtain x-ray, plan short course of mobic (to be taken with prilosec for GI prophylaxis).
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
## 2012-01-15 ENCOUNTER — Emergency Department (HOSPITAL_BASED_OUTPATIENT_CLINIC_OR_DEPARTMENT_OTHER)
Admission: EM | Admit: 2012-01-15 | Discharge: 2012-01-15 | Disposition: A | Discharge status: Home Health | Payer: 59 | Attending: Emergency Medicine | Admitting: Emergency Medicine

## 2012-01-15 ENCOUNTER — Encounter (HOSPITAL_BASED_OUTPATIENT_CLINIC_OR_DEPARTMENT_OTHER): Payer: Self-pay | Admitting: *Deleted

## 2012-01-15 DIAGNOSIS — W312XXA Contact with powered woodworking and forming machines, initial encounter: Secondary | ICD-10-CM | POA: Insufficient documentation

## 2012-01-15 DIAGNOSIS — S61409A Unspecified open wound of unspecified hand, initial encounter: Secondary | ICD-10-CM | POA: Insufficient documentation

## 2012-01-15 DIAGNOSIS — M5090 Cervical disc disorder, unspecified, unspecified cervical region: Secondary | ICD-10-CM | POA: Insufficient documentation

## 2012-01-15 DIAGNOSIS — Z79899 Other long term (current) drug therapy: Secondary | ICD-10-CM | POA: Insufficient documentation

## 2012-01-15 DIAGNOSIS — M79609 Pain in unspecified limb: Secondary | ICD-10-CM | POA: Insufficient documentation

## 2012-01-15 DIAGNOSIS — IMO0002 Reserved for concepts with insufficient information to code with codable children: Secondary | ICD-10-CM

## 2012-01-15 DIAGNOSIS — Z981 Arthrodesis status: Secondary | ICD-10-CM | POA: Insufficient documentation

## 2012-01-15 MED ORDER — GELATIN ABSORBABLE 12-7 MM EX MISC
CUTANEOUS | Status: AC
  Administered 2012-01-15: 1
  Filled 2012-01-15: qty 1

## 2012-01-15 MED ORDER — LIDOCAINE HCL (PF) 1 % IJ SOLN
INTRAMUSCULAR | Status: AC
  Administered 2012-01-15: 10 mL
  Filled 2012-01-15: qty 10

## 2012-01-15 MED ORDER — HYDROCODONE-ACETAMINOPHEN 5-500 MG PO TABS: 1.0000 | ORAL_TABLET | Freq: Four times a day (QID) | ORAL | Status: AC | PRN
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
## 2012-01-15 NOTE — ED Notes (Signed)
Using table saw cut left thumb bleeding controlled on arrival

## 2012-01-15 NOTE — Discharge Instructions (Signed)

## 2012-01-15 NOTE — ED Notes (Signed)
Dr. Fredderick Phenix at bedside for digital block.

## 2012-01-15 NOTE — ED Notes (Signed)
MD at bedside. 

## 2012-01-21 ENCOUNTER — Encounter: Payer: 59 | Admitting: Family

## 2012-01-23 ENCOUNTER — Other Ambulatory Visit: Payer: Self-pay | Admitting: *Deleted

## 2012-01-23 MED ORDER — HYDROCODONE-ACETAMINOPHEN 5-500 MG PO TABS: ORAL_TABLET | ORAL | Status: DC

## 2012-01-23 NOTE — Telephone Encounter (Signed)
Refill left on pharmacy voicemail and detailed message left on pt's cell# re: completion and to call if any questions.

## 2012-01-23 NOTE — Telephone Encounter (Signed)
Received voice message from pt requesting refill of hydrocodone. Pt states he had to r/s his f/u of 01/21/12 for 02/25/12. Rx was prescribed on 12/24/11 #60 x no refills. Pt was seen in ER on 01/15/12 and given #15 for laceration.  Please advise.

## 2012-01-23 NOTE — Telephone Encounter (Signed)
OK to give #60 with no refills.

## 2012-02-25 ENCOUNTER — Encounter: Payer: 59 | Admitting: Family

## 2012-02-26 ENCOUNTER — Telehealth: Payer: Self-pay | Admitting: Family

## 2012-02-26 MED ORDER — HYDROCODONE-ACETAMINOPHEN 5-500 MG PO TABS: ORAL_TABLET | ORAL | Status: DC

## 2012-02-26 NOTE — Telephone Encounter (Addendum)
Please confirm with his pharmacy that he filled the rx sent last month.  If so, ok to send #60 with no refills.   Please let pt know that he will need to be seen for his physical prior to additional refills.

## 2012-02-26 NOTE — Telephone Encounter (Signed)
Refill last given on 01/23/12 #60. Pt has r/s physical twice; currently scheduled for 03/25/12. Please advise re: refill.

## 2012-02-26 NOTE — Telephone Encounter (Signed)
Refill- hydrocodon-acetaminophen 5-500. Take one tablet by mouth twice a day.  °

## 2012-02-26 NOTE — Telephone Encounter (Signed)
Verified with pharmacist that last refill picked up on 01/24/12. Refill called to voicemail #60 x no refills.

## 2012-02-27 NOTE — Telephone Encounter (Signed)
Notified pt and reminded him of future appt and time. Pt voices understanding.

## 2012-03-25 ENCOUNTER — Ambulatory Visit (INDEPENDENT_AMBULATORY_CARE_PROVIDER_SITE_OTHER): Payer: 59 | Admitting: Family

## 2012-03-25 ENCOUNTER — Encounter: Payer: Self-pay | Admitting: Family

## 2012-03-25 DIAGNOSIS — R1013 Epigastric pain: Secondary | ICD-10-CM

## 2012-03-25 DIAGNOSIS — Z136 Encounter for screening for cardiovascular disorders: Secondary | ICD-10-CM

## 2012-03-25 DIAGNOSIS — Z0000 Encounter for general adult medical examination without abnormal findings: Secondary | ICD-10-CM | POA: Insufficient documentation

## 2012-03-25 DIAGNOSIS — K279 Peptic ulcer, site unspecified, unspecified as acute or chronic, without hemorrhage or perforation: Secondary | ICD-10-CM

## 2012-03-25 LAB — HEPATIC FUNCTION PANEL
AST: 14 U/L (ref 0–37)
Alkaline Phosphatase: 71 U/L (ref 39–117)
Bilirubin, Direct: 0.1 mg/dL (ref 0.0–0.3)
Indirect Bilirubin: 0.3 mg/dL (ref 0.0–0.9)
Total Protein: 6.6 g/dL (ref 6.0–8.3)

## 2012-03-25 LAB — CBC WITH DIFFERENTIAL/PLATELET
Basophils Absolute: 0 10*3/uL (ref 0.0–0.1)
Eosinophils Relative: 3 % (ref 0–5)
HCT: 51.7 % (ref 39.0–52.0)
Hemoglobin: 17.3 g/dL — ABNORMAL HIGH (ref 13.0–17.0)
Lymphocytes Relative: 37 % (ref 12–46)
Lymphs Abs: 2.2 10*3/uL (ref 0.7–4.0)
Monocytes Absolute: 0.5 10*3/uL (ref 0.1–1.0)
Platelets: 241 10*3/uL (ref 150–400)
RBC: 5.57 MIL/uL (ref 4.22–5.81)
RDW: 13.7 % (ref 11.5–15.5)
WBC: 6 10*3/uL (ref 4.0–10.5)

## 2012-03-25 LAB — LIPID PANEL
LDL Cholesterol: 114 mg/dL — ABNORMAL HIGH (ref 0–99)
Triglycerides: 69 mg/dL (ref <150)
VLDL: 14 mg/dL (ref 0–40)

## 2012-03-25 LAB — TSH: TSH: 1.197 u[IU]/mL (ref 0.350–4.500)

## 2012-03-25 LAB — BASIC METABOLIC PANEL WITH GFR
Chloride: 105 mEq/L (ref 96–112)
GFR, Est African American: >89 mL/min
GFR, Est Non African American: >89 mL/min
Glucose, Bld: 91 mg/dL (ref 70–99)
Potassium: 4.9 mEq/L (ref 3.5–5.3)
Sodium: 137 mEq/L (ref 135–145)

## 2012-03-25 MED ORDER — HYDROCODONE-ACETAMINOPHEN 5-500 MG PO TABS: ORAL_TABLET | ORAL | Status: DC
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
## 2012-03-25 NOTE — Patient Instructions (Signed)
Please complete your stool kit and mail back. Complete blood work prior to leaving.  Follow up in 6 months.

## 2012-03-25 NOTE — Assessment & Plan Note (Signed)
Pt encouraged to continue healthy diet, exercise.  Obtain fasting labs today.  He will [facility] the [facility] to verify tetanus shot administration last year. EKG is performed and reviewed today- NSR- rate 55.

## 2012-03-25 NOTE — Assessment & Plan Note (Signed)
Mild epigastric tenderness.  Obtain IFOB, instructed pt to take prilosec daily not prn.  Will also check lipase.

## 2012-03-26 LAB — URINALYSIS, ROUTINE W REFLEX MICROSCOPIC
Bilirubin Urine: NEGATIVE
Glucose, UA: NEGATIVE mg/dL
Hgb urine dipstick: NEGATIVE
Ketones, ur: NEGATIVE mg/dL
Leukocytes, UA: NEGATIVE
Nitrite: NEGATIVE
Protein, ur: NEGATIVE mg/dL
Urobilinogen, UA: 0.2 mg/dL (ref 0.0–1.0)
pH: 5.5 (ref 5.0–8.0)

## 2012-03-31 ENCOUNTER — Encounter: Payer: Self-pay | Admitting: Family

## 2012-04-16 ENCOUNTER — Other Ambulatory Visit: Payer: 59

## 2012-04-16 DIAGNOSIS — K279 Peptic ulcer, site unspecified, unspecified as acute or chronic, without hemorrhage or perforation: Secondary | ICD-10-CM

## 2012-04-23 ENCOUNTER — Telehealth: Payer: Self-pay | Admitting: Family

## 2012-04-23 NOTE — Telephone Encounter (Signed)
60

## 2012-04-23 NOTE — Telephone Encounter (Signed)
Last refill was provided on 03/25/12. Is it ok to call in #60 x no refills.

## 2012-04-23 NOTE — Telephone Encounter (Signed)
Refill- hydrocodon-acetaminophen 5-500. Take one tablet by mouth twice a day.  °

## 2012-04-24 MED ORDER — HYDROCODONE-ACETAMINOPHEN 5-500 MG PO TABS: ORAL_TABLET | ORAL | Status: DC

## 2012-04-24 NOTE — Telephone Encounter (Signed)
Verbal refill provided to pharmacist per Dr Hodgin approval. 

## 2012-05-22 ENCOUNTER — Telehealth: Payer: Self-pay | Admitting: Family

## 2012-05-22 MED ORDER — HYDROCODONE-ACETAMINOPHEN 5-500 MG PO TABS: ORAL_TABLET | ORAL | Status: DC

## 2012-05-22 NOTE — Telephone Encounter (Signed)
Refill- hydrocodone-acetaminophen 5-500. Take one tablet by mouth twice a day.

## 2012-05-22 NOTE — Telephone Encounter (Signed)
60

## 2012-05-22 NOTE — Telephone Encounter (Signed)
Pt last seen on 03/25/12. Rx last filled 04/24/12 #60.  Please advise re: refill.

## 2012-05-22 NOTE — Telephone Encounter (Signed)
[facility].

## 2012-06-19 ENCOUNTER — Telehealth: Payer: Self-pay | Admitting: Family

## 2012-06-19 NOTE — Telephone Encounter (Signed)
60

## 2012-06-19 NOTE — Telephone Encounter (Signed)
Refill- hydrocodon-acetaminophen 5-500. Take one tablet by mouth twice a day.

## 2012-06-19 NOTE — Telephone Encounter (Signed)
Called rx into pharmacy spoke with Jonny Ruiz gave refill authorization. MD already updated EPIC.. 06/19/12@11 :15am/LMB

## 2012-07-17 ENCOUNTER — Telehealth: Payer: Self-pay | Admitting: Family

## 2012-07-17 MED ORDER — HYDROCODONE-ACETAMINOPHEN 5-500 MG PO TABS: ORAL_TABLET | ORAL | Status: DC

## 2012-07-17 NOTE — Telephone Encounter (Signed)
Refill left on pharmacy voicemail #60 x no refills and not to fill rx before 07/19/12.

## 2012-07-17 NOTE — Telephone Encounter (Signed)
Refill- hydrocodon-acetaminophen 5-500. Take one tablet by mouth twice a day.  °

## 2012-08-13 ENCOUNTER — Other Ambulatory Visit: Payer: Self-pay | Admitting: Family

## 2012-08-13 NOTE — Telephone Encounter (Signed)
Hold request until Friday as rx was last filled on 07/19/12.

## 2012-08-15 NOTE — Telephone Encounter (Signed)
[facility]codone [facility]ill [facility] #60 x no refills and not to fill Rx before 08/18/12.

## 2012-09-17 ENCOUNTER — Other Ambulatory Visit: Payer: Self-pay | Admitting: Family

## 2012-09-18 NOTE — Telephone Encounter (Signed)
[facility] 

## 2012-10-06 ENCOUNTER — Ambulatory Visit (INDEPENDENT_AMBULATORY_CARE_PROVIDER_SITE_OTHER): Payer: No Typology Code available for payment source | Admitting: Family

## 2012-10-06 ENCOUNTER — Encounter: Payer: Self-pay | Admitting: Family

## 2012-10-06 ENCOUNTER — Ambulatory Visit: Payer: 59 | Admitting: Family

## 2012-10-06 VITALS — BP 128/88 | HR 53 | Temp 97.8°F | Resp 16 | Ht 68.5 in | Wt 143.0 lb

## 2012-10-06 DIAGNOSIS — M542 Cervicalgia: Secondary | ICD-10-CM

## 2012-10-06 DIAGNOSIS — K219 Gastro-esophageal reflux disease without esophagitis: Secondary | ICD-10-CM | POA: Insufficient documentation

## 2012-10-06 MED ORDER — OMEPRAZOLE 40 MG PO CPDR: 40.0000 mg | DELAYED_RELEASE_CAPSULE | Freq: Every day | ORAL | Status: DC

## 2012-10-06 NOTE — Assessment & Plan Note (Signed)
Stable on hydrocodone.  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  Continue hydrocodone for neck pain and hand pain.
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
## 2012-10-06 NOTE — Patient Instructions (Addendum)
Please follow up in June for a fasting physical.  Call if your reflux symptoms do not improve with the increase in dose of your omeprazole.

## 2012-10-06 NOTE — Assessment & Plan Note (Signed)
Uncontrolled on otc prilosec.  Will increase to 40mg  once daily.

## 2012-10-17 ENCOUNTER — Other Ambulatory Visit: Payer: Self-pay | Admitting: Family

## 2012-10-17 NOTE — Telephone Encounter (Signed)
Hydrocodone 5-500mg  1 tablet twice a day #60 x no refills left on pharmacy voicemail.

## 2012-11-14 ENCOUNTER — Other Ambulatory Visit: Payer: Self-pay | Admitting: Family

## 2012-11-14 NOTE — Telephone Encounter (Signed)
REfill [facility] for [facility]codone acet 5-500mg  #60 x no refills and NOT TO FILL RX BEFORE 11/17/12.

## 2012-12-15 ENCOUNTER — Other Ambulatory Visit: Payer: Self-pay | Admitting: Family

## 2013-01-09 ENCOUNTER — Other Ambulatory Visit: Payer: Self-pay | Admitting: Family

## 2013-01-12 NOTE — Telephone Encounter (Signed)
Hydrocodone-APAP request [Last Rx 02.17.14 #[id number]]/[facility] Please advise.

## 2013-01-12 NOTE — Telephone Encounter (Signed)
OK to send 60 tabs no refills.  

## 2013-01-13 NOTE — Telephone Encounter (Signed)
Rx phoned to pharmacy/[facility] 

## 2013-02-09 ENCOUNTER — Other Ambulatory Visit: Payer: Self-pay | Admitting: Family

## 2013-02-09 NOTE — Telephone Encounter (Signed)
OK to send #60 with zero refills. 

## 2013-03-09 ENCOUNTER — Telehealth: Payer: Self-pay | Admitting: Family

## 2013-03-09 NOTE — Telephone Encounter (Signed)
Refill- hydrocodon-acetaminophen 5-325. Take one tablet by mouth twice a day as needed for pain. Qty 60

## 2013-03-10 NOTE — Telephone Encounter (Signed)
OK to send #60 no refills. 

## 2013-03-11 MED ORDER — HYDROCODONE-ACETAMINOPHEN 5-325 MG PO TABS: ORAL_TABLET | ORAL | Status: DC

## 2013-03-11 NOTE — Telephone Encounter (Signed)
Refill left on pharmacy voicemail. 

## 2013-04-07 ENCOUNTER — Encounter: Payer: Self-pay | Admitting: Family

## 2013-04-07 ENCOUNTER — Ambulatory Visit (INDEPENDENT_AMBULATORY_CARE_PROVIDER_SITE_OTHER): Payer: No Typology Code available for payment source | Admitting: Family

## 2013-04-07 VITALS — BP 140/80 | HR 58 | Temp 97.6°F | Resp 16 | Ht 68.0 in | Wt 136.0 lb

## 2013-04-07 DIAGNOSIS — M542 Cervicalgia: Secondary | ICD-10-CM

## 2013-04-07 DIAGNOSIS — Z23 Encounter for immunization: Secondary | ICD-10-CM

## 2013-04-07 DIAGNOSIS — Z0000 Encounter for general adult medical examination without abnormal findings: Secondary | ICD-10-CM

## 2013-04-07 LAB — HEPATIC FUNCTION PANEL
ALT: 14 U/L (ref 0–53)
AST: 14 U/L (ref 0–37)
Bilirubin, Direct: 0.1 mg/dL (ref 0.0–0.3)
Total Protein: 6.6 g/dL (ref 6.0–8.3)

## 2013-04-07 LAB — URINALYSIS, ROUTINE W REFLEX MICROSCOPIC
Glucose, UA: NEGATIVE mg/dL
Leukocytes, UA: NEGATIVE
Protein, ur: NEGATIVE mg/dL
Specific Gravity, Urine: 1.019 (ref 1.005–1.030)
Urobilinogen, UA: 0.2 mg/dL (ref 0.0–1.0)

## 2013-04-07 LAB — CBC WITH DIFFERENTIAL/PLATELET
Basophils Relative: 0 % (ref 0–1)
Eosinophils Relative: 2 % (ref 0–5)
HCT: 46.2 % (ref 39.0–52.0)
Hemoglobin: 17 g/dL (ref 13.0–17.0)
MCH: 32.8 pg (ref 26.0–34.0)
MCHC: 36.8 g/dL — ABNORMAL HIGH (ref 30.0–36.0)
MCV: 89 fL (ref 78.0–100.0)
Monocytes Absolute: 0.8 10*3/uL (ref 0.1–1.0)
Monocytes Relative: 10 % (ref 3–12)
Neutro Abs: 4.5 10*3/uL (ref 1.7–7.7)
RDW: 13.7 % (ref 11.5–15.5)

## 2013-04-07 LAB — LIPID PANEL
Cholesterol: 181 mg/dL (ref 0–200)
LDL Cholesterol: 107 mg/dL — ABNORMAL HIGH (ref 0–99)
VLDL: 18 mg/dL (ref 0–40)

## 2013-04-07 MED ORDER — HYDROCODONE-ACETAMINOPHEN 5-325 MG PO TABS: ORAL_TABLET | ORAL | Status: DC

## 2013-04-07 NOTE — Patient Instructions (Addendum)
You will be contacted about your referral to pain management.  Please let us know if you have not heard back within 1 week about your referral. Please complete your lab work prior to leaving.  Follow up in 1 year, sooner if problems or concerns.

## 2013-04-07 NOTE — Assessment & Plan Note (Addendum)
We discussed healthy diet.  Tetanus today, fasting labs, EKG.
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
## 2013-04-07 NOTE — Assessment & Plan Note (Signed)
Will refer to pain clinic for ongoing pain management.

## 2013-04-08 LAB — BASIC METABOLIC PANEL WITH GFR
BUN: 11 mg/dL (ref 6–23)
CO2: 26 mEq/L (ref 19–32)
Chloride: 107 mEq/L (ref 96–112)
Creat: 1.1 mg/dL (ref 0.50–1.35)
GFR, Est Non African American: 82 mL/min
Glucose, Bld: 58 mg/dL — ABNORMAL LOW (ref 70–99)
Potassium: 4.2 mEq/L (ref 3.5–5.3)

## 2013-04-09 ENCOUNTER — Encounter: Payer: Self-pay | Admitting: Family

## 2013-05-08 ENCOUNTER — Encounter: Payer: Self-pay | Admitting: Nurse Practitioner

## 2013-05-08 ENCOUNTER — Ambulatory Visit (INDEPENDENT_AMBULATORY_CARE_PROVIDER_SITE_OTHER): Payer: No Typology Code available for payment source | Admitting: Nurse Practitioner

## 2013-05-08 VITALS — BP 124/92 | HR 61 | Temp 97.6°F | Resp 16 | Wt 139.0 lb

## 2013-05-08 DIAGNOSIS — R3129 Other microscopic hematuria: Secondary | ICD-10-CM

## 2013-05-08 DIAGNOSIS — T148 Other injury of unspecified body region: Secondary | ICD-10-CM

## 2013-05-08 DIAGNOSIS — R3915 Urgency of urination: Secondary | ICD-10-CM

## 2013-05-08 DIAGNOSIS — W57XXXA Bitten or stung by nonvenomous insect and other nonvenomous arthropods, initial encounter: Secondary | ICD-10-CM

## 2013-05-08 LAB — POCT URINALYSIS DIPSTICK
Bilirubin, UA: NEGATIVE
Nitrite, UA: NEGATIVE
Spec Grav, UA: 1.025
pH, UA: 6

## 2013-05-08 NOTE — Patient Instructions (Addendum)
You probably passed a small kidney stone that caused some local irritation. Keep sipping fluids throughout the day to keep fluid moving through kidneys. This can prevent crystal accumulation and stone formation. You should have urine rechecked in 1 month to see if blood clears. If you have trouble urinating or develop flank or groin pain, fever, please be re-evaluated sooner. Apply bacitracin to insect bite. Wash twice daily with soap & water. Kidney Stones Kidney stones (ureteral lithiasis) are deposits that form inside your kidneys. The intense pain is caused by the stone moving through the urinary tract. When the stone moves, the ureter goes into spasm around the stone. The stone is usually passed in the urine.  CAUSES   A disorder that makes certain neck glands produce too much parathyroid hormone (primary hyperparathyroidism).  A buildup of uric acid crystals.  Narrowing (stricture) of the ureter.  A kidney obstruction present at birth (congenital obstruction).  Previous surgery on the kidney or ureters.  Numerous kidney infections. SYMPTOMS   Feeling sick to your stomach (nauseous).  Throwing up (vomiting).  Blood in the urine (hematuria).  Pain that usually spreads (radiates) to the groin.  Frequency or urgency of urination. DIAGNOSIS   Taking a history and physical exam.  Blood or urine tests.  Computerized X-ray scan (CT scan).  Occasionally, an examination of the inside of the urinary bladder (cystoscopy) is performed. TREATMENT   Observation.  Increasing your fluid intake.  Surgery may be needed if you have severe pain or persistent obstruction. The size, location, and chemical composition are all important variables that will determine the proper choice of action for you. Talk to your caregiver to better understand your situation so that you will minimize the risk of injury to yourself and your kidney.  HOME CARE INSTRUCTIONS   Drink enough water and fluids  to keep your urine clear or pale yellow.  Strain all urine through the provided strainer. Keep all particulate matter and stones for your caregiver to see. The stone causing the pain may be as small as a grain of salt. It is very important to use the strainer each and every time you pass your urine. The collection of your stone will allow your caregiver to analyze it and verify that a stone has actually passed.  Only take over-the-counter or prescription medicines for pain, discomfort, or fever as directed by your caregiver.  Make a follow-up appointment with your caregiver as directed.  Get follow-up X-rays if required. The absence of pain does not always mean that the stone has passed. It may have only stopped moving. If the urine remains completely obstructed, it can cause loss of kidney function or even complete destruction of the kidney. It is your responsibility to make sure X-rays and follow-ups are completed. Ultrasounds of the kidney can show blockages and the status of the kidney. Ultrasounds are not associated with any radiation and can be performed easily in a matter of minutes. SEEK IMMEDIATE MEDICAL CARE IF:   Pain cannot be controlled with the prescribed medicine.  You have a fever.  The severity or intensity of pain increases over 18 hours and is not relieved by pain medicine.  You develop a new onset of abdominal pain.  You feel faint or pass out. MAKE SURE YOU:   Understand these instructions.  Will watch your condition.  Will get help right away if you are not doing well or get worse. Document Released: 10/15/2005 Document Revised: 01/07/2012 Document Reviewed: 02/10/2010 ExitCare Patient  Information 2014 [place], [place].

## 2013-05-08 NOTE — Progress Notes (Signed)
Subjective:      Zachary Berg is a 42 y.o. male here for evaluation of urinary hesitancy and urethral burining. Patient has not had these symptoms before. He describes having trouble urinating 3 days ago, then finally was able to urinate, but has sharp sensation at the urethra times 1, followed by 2 days of improving urethral burning. All symptoms resolved today. No history of kidney stones. Patient denies history of fever, flank pain, groin pain & abdominal pain. Also he states he got 2 insect bites on chest 2 days ago while cat fishing. They are not painful , some itching.  The following portions of the patient's history were reviewed and updated as appropriate: allergies, current medications, past medical history, past surgical history and problem list.  Review of Systems Constitutional: negative Respiratory: negative Gastrointestinal: negative Genitourinary:positive for dysuria and hesitancy, negative for frequency, hematuria, nocturia and urinary incontinence Integument/breast: 2 insect bites L chest     Objective:    BP 124/92  Pulse 61  Temp(Src) 97.6 F (36.4 C) (Oral)  Resp 16  Wt 139 lb (63.05 kg)  BMI 21.14 kg/m2  SpO2 99% General appearance: alert, cooperative, appears older than stated age and no distress Head: Normocephalic, without obvious abnormality, atraumatic Eyes: positive findings: sclera injected bilaterally Back: no CVA tenderness Abdomen: normal findings: bowel sounds normal, no masses palpable, no organomegaly, no renal abnormalities palpable, no scars, striae, dilated veins, rashes, or lesions, symmetric and umbilicus normal and abnormal findings:  tender over R kidney with deep palpation Skin: 2 small (2mm) lesions L chest over pects, scant erythema around lesions, scant serous drainage.  Lab Review UA has been reviewed. UA results: microscopic hematuria   Assessment:  Insect bites, no infection  Hematuria likely related to having passed small kidney  stone 3 days ago.   Plan:  See instruction for insect bite care.  Discussed the evaluation and differential diagnosis of hematuria. Follow up in 1 month.  send urine culture & microscopy

## 2013-05-09 LAB — URINALYSIS, ROUTINE W REFLEX MICROSCOPIC
Bilirubin Urine: NEGATIVE
Glucose, UA: NEGATIVE mg/dL
Ketones, ur: NEGATIVE mg/dL
Specific Gravity, Urine: 1.02 (ref 1.005–1.030)
Urobilinogen, UA: 0.2 mg/dL (ref 0.0–1.0)

## 2013-05-09 LAB — URINALYSIS, MICROSCOPIC ONLY
Casts: NONE SEEN
Crystals: NONE SEEN
Squamous Epithelial / LPF: NONE SEEN

## 2013-05-09 LAB — URINE CULTURE
Colony Count: NO GROWTH
Organism ID, Bacteria: NO GROWTH

## 2013-05-11 ENCOUNTER — Other Ambulatory Visit: Payer: Self-pay | Admitting: Family

## 2013-05-11 DIAGNOSIS — M542 Cervicalgia: Secondary | ICD-10-CM

## 2013-05-11 NOTE — Telephone Encounter (Signed)
OK to send #60 tabs no refills. However, as we discussed at his last visit, I will make referral to pain management and let them assume care of his chronic pain going forward.

## 2013-05-11 NOTE — Telephone Encounter (Signed)
Rx left on [facility] voicemail as below.

## 2013-05-11 NOTE — Telephone Encounter (Signed)
eScribe request for refill on Hydrocodone-APAP Last filled - 06.10.14, #60x0 Last AEX - 06.10.14, [Acute 07.11.14] Next AEX - 1-yr from CPE Please advise on refills/SLS

## 2013-05-30 ENCOUNTER — Other Ambulatory Visit: Payer: Self-pay | Admitting: Family

## 2013-06-01 NOTE — Telephone Encounter (Signed)
Rx request to pharmacy/SLS  

## 2013-06-08 ENCOUNTER — Telehealth: Payer: Self-pay | Admitting: *Deleted

## 2013-06-08 ENCOUNTER — Ambulatory Visit: Payer: No Typology Code available for payment source | Admitting: Family

## 2013-06-08 DIAGNOSIS — G8929 Other chronic pain: Secondary | ICD-10-CM

## 2013-06-08 NOTE — Telephone Encounter (Signed)
Faxed refill request received from pharmacy for Hydrocodone-APAP Last filled by MD on 07.14.14, #60x0 Last AEX - 06.10.14 Next AEX - 1-yr Please Advise/SLS

## 2013-06-08 NOTE — Telephone Encounter (Signed)
OK to refill but warn him that if he needs to take this med routinely he will  Have to be seen every 6 months not annually to monitor

## 2013-06-09 MED ORDER — HYDROCODONE-ACETAMINOPHEN 5-325 MG PO TABS: ORAL_TABLET | ORAL | Status: DC

## 2013-06-09 NOTE — Telephone Encounter (Signed)
FYI: Rx request phoned to pharmacy; pt informed of provider instructions, he was denied at [facility] d/t not accepting his Insurance & OOP cost would be about $6000.; he will discuss with provider at upcoming OV on Fri, 08.15.14/SLS

## 2013-06-12 ENCOUNTER — Ambulatory Visit (INDEPENDENT_AMBULATORY_CARE_PROVIDER_SITE_OTHER): Payer: No Typology Code available for payment source | Admitting: Family

## 2013-06-12 ENCOUNTER — Encounter: Payer: Self-pay | Admitting: Family

## 2013-06-12 VITALS — BP 130/84 | HR 57 | Temp 97.8°F | Resp 16 | Ht 68.0 in | Wt 138.1 lb

## 2013-06-12 DIAGNOSIS — M542 Cervicalgia: Secondary | ICD-10-CM

## 2013-06-12 DIAGNOSIS — K219 Gastro-esophageal reflux disease without esophagitis: Secondary | ICD-10-CM

## 2013-06-12 DIAGNOSIS — F329 Major depressive disorder, single episode, unspecified: Secondary | ICD-10-CM

## 2013-06-12 DIAGNOSIS — K279 Peptic ulcer, site unspecified, unspecified as acute or chronic, without hemorrhage or perforation: Secondary | ICD-10-CM

## 2013-06-12 NOTE — Assessment & Plan Note (Signed)
Stable off of meds.  Monitor.  
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
## 2013-06-12 NOTE — Patient Instructions (Addendum)
Please follow up in June for complete physical.

## 2013-06-12 NOTE — Assessment & Plan Note (Signed)
Stable on PPI. Continue same.  

## 2013-06-12 NOTE — Assessment & Plan Note (Signed)
Current stable.  Monitor.

## 2013-07-06 ENCOUNTER — Other Ambulatory Visit: Payer: Self-pay | Admitting: Family Medicine

## 2013-07-08 ENCOUNTER — Telehealth: Payer: Self-pay | Admitting: *Deleted

## 2013-07-08 DIAGNOSIS — G8929 Other chronic pain: Secondary | ICD-10-CM

## 2013-07-08 NOTE — Telephone Encounter (Signed)
Faxed refill request received from pharmacy for Hydrocodone-APAP Last filled by MD on 08.12.14, #60x0 Last AEX - 08.15.14 Please Advise/SLS

## 2013-07-09 MED ORDER — HYDROCODONE-ACETAMINOPHEN 5-325 MG PO TABS: ORAL_TABLET | ORAL | Status: DC

## 2013-07-09 NOTE — Telephone Encounter (Signed)
Ok to send #60 with no refills.

## 2013-07-09 NOTE — Telephone Encounter (Signed)
Rx called to pharmacy voicemail as below. 

## 2013-08-06 ENCOUNTER — Telehealth: Payer: Self-pay | Admitting: *Deleted

## 2013-08-06 DIAGNOSIS — G8929 Other chronic pain: Secondary | ICD-10-CM

## 2013-08-06 NOTE — Telephone Encounter (Signed)
OK to send #60 with no refills.

## 2013-08-06 NOTE — Telephone Encounter (Signed)
Refill request for Hydrocodone-APAP Last filled by MD on - 09.11.14, #60x0 Last AEX - 08.15.14 Next AEX - June for CPE Patient has been informed of new drug classification & instructions for receiving medication; also that it will be tomorrow morning before provider returns to office to answer request; saved new Controlled Substance Contract for Rx in EMR. Please Advise/SLS

## 2013-08-07 MED ORDER — HYDROCODONE-ACETAMINOPHEN 5-325 MG PO TABS: ORAL_TABLET | ORAL | Status: DC

## 2013-08-07 NOTE — Telephone Encounter (Signed)
Rx cannot be called in now. Rx was printed and forwarded to Provider for signature.

## 2013-08-10 NOTE — Telephone Encounter (Signed)
Notified pt that Rx is ready for pick up at the front desk and made him aware of new classification of drug and need to sign a controlled substance contract. Pt voices understanding and states he is going to be using [facility] [name] in [place] from now on. Letter printed and placed at front desk with rx to be completed when rx is picked up today.

## 2013-08-10 NOTE — Telephone Encounter (Signed)
Rx signed.

## 2013-09-09 ENCOUNTER — Telehealth: Payer: Self-pay | Admitting: Family

## 2013-09-09 DIAGNOSIS — G8929 Other chronic pain: Secondary | ICD-10-CM

## 2013-09-09 MED ORDER — HYDROCODONE-ACETAMINOPHEN 5-325 MG PO TABS: ORAL_TABLET | ORAL | Status: DC

## 2013-09-09 NOTE — Telephone Encounter (Signed)
Last Rx provided 08/07/13, #60.  Rx printed and forwarded to Provider for signature.

## 2013-09-09 NOTE — Telephone Encounter (Signed)
Rx signed, placed at front desk and detailed message left on voicemail re: completion.

## 2013-09-09 NOTE — Telephone Encounter (Signed)
Patient wife is requesting a new hydrocodone prescription for patient

## 2013-10-08 ENCOUNTER — Other Ambulatory Visit: Payer: Self-pay | Admitting: Family

## 2013-10-08 DIAGNOSIS — G8929 Other chronic pain: Secondary | ICD-10-CM

## 2013-10-08 MED ORDER — HYDROCODONE-ACETAMINOPHEN 5-325 MG PO TABS: ORAL_TABLET | ORAL | Status: DC

## 2013-10-08 NOTE — Telephone Encounter (Signed)
Please advise refill?  Last RX was wrote on 09-09-13 quantity 60 with 0 refills

## 2013-10-08 NOTE — Telephone Encounter (Signed)
See rx. 

## 2013-10-08 NOTE — Telephone Encounter (Signed)
Refill hydrocodone  Needs written rx  Please call when ready

## 2013-10-09 NOTE — Telephone Encounter (Signed)
Notified pt and placed rx a front desk for pick up.

## 2013-11-06 ENCOUNTER — Telehealth: Payer: Self-pay | Admitting: *Deleted

## 2013-11-06 DIAGNOSIS — G8929 Other chronic pain: Secondary | ICD-10-CM

## 2013-11-06 DIAGNOSIS — M542 Cervicalgia: Principal | ICD-10-CM

## 2013-11-06 MED ORDER — HYDROCODONE-ACETAMINOPHEN 5-325 MG PO TABS: ORAL_TABLET | ORAL | Status: DC

## 2013-11-06 NOTE — Telephone Encounter (Signed)
Rx sent to front desk and message left on cell# re: Rx completion.

## 2013-11-06 NOTE — Telephone Encounter (Signed)
Pt left message requesting refill of hydrocodone. Last rx printed 10/08/13, #60 x no refills. Please advise if ok to print Rx.

## 2013-11-06 NOTE — Telephone Encounter (Signed)
Signed.

## 2013-11-24 ENCOUNTER — Telehealth: Payer: Self-pay | Admitting: Family

## 2013-11-24 NOTE — Telephone Encounter (Signed)
Appointment scheduled for tomorrow morning.

## 2013-11-24 NOTE — Telephone Encounter (Signed)
Lm with family to return call to sch appt

## 2013-11-24 NOTE — Telephone Encounter (Signed)
Pt needs to be seen to determine if antibiotic will be needed.  Please call him to arrange appt.

## 2013-11-24 NOTE — Telephone Encounter (Signed)
Running fever, congestion since Sunday, does he need to come in for appt or can something be called in? Please advise

## 2013-11-25 ENCOUNTER — Ambulatory Visit (INDEPENDENT_AMBULATORY_CARE_PROVIDER_SITE_OTHER): Payer: No Typology Code available for payment source | Admitting: Family

## 2013-11-25 ENCOUNTER — Encounter: Payer: Self-pay | Admitting: Family

## 2013-11-25 VITALS — BP 122/82 | HR 97 | Temp 98.6°F | Resp 22 | Wt 139.1 lb

## 2013-11-25 DIAGNOSIS — R059 Cough, unspecified: Secondary | ICD-10-CM

## 2013-11-25 DIAGNOSIS — J069 Acute upper respiratory infection, unspecified: Secondary | ICD-10-CM

## 2013-11-25 DIAGNOSIS — R05 Cough: Secondary | ICD-10-CM

## 2013-11-25 LAB — POCT INFLUENZA A/B
INFLUENZA A, POC: NEGATIVE
Influenza B, POC: NEGATIVE

## 2013-11-25 MED ORDER — ALBUTEROL SULFATE HFA 108 (90 BASE) MCG/ACT IN AERS: 2.0000 | INHALATION_SPRAY | Freq: Four times a day (QID) | RESPIRATORY_TRACT | Status: DC | PRN

## 2013-11-25 MED ORDER — ALBUTEROL SULFATE (2.5 MG/3ML) 0.083% IN NEBU
2.5000 mg | INHALATION_SOLUTION | Freq: Once | RESPIRATORY_TRACT | Status: AC
  Administered 2013-11-25: 2.5 mg via RESPIRATORY_TRACT
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
## 2013-11-25 NOTE — Patient Instructions (Addendum)
Please use albuterol 2 puffs every 6 hours while awake for the next few days. Call if symptoms worsen, or if not improved in 2-3 days. Upper Respiratory Infection, Adult An upper respiratory infection (URI) is also sometimes known as the common cold. The upper respiratory tract includes the nose, sinuses, throat, trachea, and bronchi. Bronchi are the airways leading to the lungs. Most people improve within 1 week, but symptoms can last up to 2 weeks. A residual cough may last even longer.  CAUSES Many different viruses can infect the tissues lining the upper respiratory tract. The tissues become irritated and inflamed and often become very moist. Mucus production is also common. A cold is contagious. You can easily spread the virus to others by oral contact. This includes kissing, sharing a glass, coughing, or sneezing. Touching your mouth or nose and then touching a surface, which is then touched by another person, can also spread the virus. SYMPTOMS  Symptoms typically develop 1 to 3 days after you come in contact with a cold virus. Symptoms vary from person to person. They may include:  Runny nose.  Sneezing.  Nasal congestion.  Sinus irritation.  Sore throat.  Loss of voice (laryngitis).  Cough.  Fatigue.  Muscle aches.  Loss of appetite.  Headache.  Low-grade fever. DIAGNOSIS  You might diagnose your own cold based on familiar symptoms, since most people get a cold 2 to 3 times a year. Your caregiver can confirm this based on your exam. Most importantly, your caregiver can check that your symptoms are not due to another disease such as strep throat, sinusitis, pneumonia, asthma, or epiglottitis. Blood tests, throat tests, and X-rays are not necessary to diagnose a common cold, but they may sometimes be helpful in excluding other more serious diseases. Your caregiver will decide if any further tests are required. RISKS AND COMPLICATIONS  You may be at risk for a more severe  case of the common cold if you smoke cigarettes, have chronic heart disease (such as heart failure) or lung disease (such as asthma), or if you have a weakened immune system. The very young and very old are also at risk for more serious infections. Bacterial sinusitis, middle ear infections, and bacterial pneumonia can complicate the common cold. The common cold can worsen asthma and chronic obstructive pulmonary disease (COPD). Sometimes, these complications can require emergency medical care and may be life-threatening. PREVENTION  The best way to protect against getting a cold is to practice good hygiene. Avoid oral or hand contact with people with cold symptoms. Wash your hands often if contact occurs. There is no clear evidence that vitamin C, vitamin E, echinacea, or exercise reduces the chance of developing a cold. However, it is always recommended to get plenty of rest and practice good nutrition. TREATMENT  Treatment is directed at relieving symptoms. There is no cure. Antibiotics are not effective, because the infection is caused by a virus, not by bacteria. Treatment may include:  Increased fluid intake. Sports drinks offer valuable electrolytes, sugars, and fluids.  Breathing heated mist or steam (vaporizer or shower).  Eating chicken soup or other clear broths, and maintaining good nutrition.  Getting plenty of rest.  Using gargles or lozenges for comfort.  Controlling fevers with ibuprofen or acetaminophen as directed by your caregiver.  Increasing usage of your inhaler if you have asthma. Zinc gel and zinc lozenges, taken in the first 24 hours of the common cold, can shorten the duration and lessen the severity of symptoms.  Pain medicines may help with fever, muscle aches, and throat pain. A variety of non-prescription medicines are available to treat congestion and runny nose. Your caregiver can make recommendations and may suggest nasal or lung inhalers for other symptoms.  HOME  CARE INSTRUCTIONS   Only take over-the-counter or prescription medicines for pain, discomfort, or fever as directed by your caregiver.  Use a warm mist humidifier or inhale steam from a shower to increase air moisture. This may keep secretions moist and make it easier to breathe.  Drink enough water and fluids to keep your urine clear or pale yellow.  Rest as needed.  Return to work when your temperature has returned to normal or as your caregiver advises. You may need to stay home longer to avoid infecting others. You can also use a face mask and careful hand washing to prevent spread of the virus. SEEK MEDICAL CARE IF:   After the first few days, you feel you are getting worse rather than better.  You need your caregiver's advice about medicines to control symptoms.  You develop chills, worsening shortness of breath, or brown or red sputum. These may be signs of pneumonia.  You develop yellow or brown nasal discharge or pain in the face, especially when you bend forward. These may be signs of sinusitis.  You develop a fever, swollen neck glands, pain with swallowing, or white areas in the back of your throat. These may be signs of strep throat. SEEK IMMEDIATE MEDICAL CARE IF:   You have a fever.  You develop severe or persistent headache, ear pain, sinus pain, or chest pain.  You develop wheezing, a prolonged cough, cough up blood, or have a change in your usual mucus (if you have chronic lung disease).  You develop sore muscles or a stiff neck. Document Released: 04/10/2001 Document Revised: 01/07/2012 Document Reviewed: 02/16/2011 [facility] Patient Information 2014 [employer], [place].

## 2013-11-25 NOTE — Progress Notes (Signed)
Pre visit review using our clinic review tool, if applicable. No additional management support is needed unless otherwise documented below in the visit note. 

## 2013-11-26 DIAGNOSIS — J069 Acute upper respiratory infection, unspecified: Secondary | ICD-10-CM | POA: Insufficient documentation

## 2013-11-26 NOTE — Assessment & Plan Note (Signed)
Symptoms most consistent with viral URI.  Rapid flu swab negative.  Recommended supportive measures and albuterol PRN (did respond well to albuterol neb in office). Follow up if symptoms worsen or if symptoms do not improve.

## 2013-12-07 ENCOUNTER — Telehealth: Payer: Self-pay | Admitting: *Deleted

## 2013-12-07 DIAGNOSIS — G8929 Other chronic pain: Secondary | ICD-10-CM

## 2013-12-07 DIAGNOSIS — M542 Cervicalgia: Principal | ICD-10-CM

## 2013-12-07 MED ORDER — HYDROCODONE-ACETAMINOPHEN 5-325 MG PO TABS: ORAL_TABLET | ORAL | Status: DC

## 2013-12-07 NOTE — Telephone Encounter (Signed)
Notified pt that rx has been sent to front desk for pick up. 

## 2013-12-07 NOTE — Telephone Encounter (Signed)
Pt left message requesting refill of hydrocodone. Last rx given 11/06/13, #60. Rx printed and forwarded to Provider for signature.

## 2014-01-06 ENCOUNTER — Telehealth: Payer: Self-pay | Admitting: Family

## 2014-01-06 DIAGNOSIS — G8929 Other chronic pain: Secondary | ICD-10-CM

## 2014-01-06 DIAGNOSIS — M542 Cervicalgia: Principal | ICD-10-CM

## 2014-01-06 NOTE — Telephone Encounter (Signed)
Patient is requesting a new hydrocodone rx °

## 2014-01-08 MED ORDER — HYDROCODONE-ACETAMINOPHEN 5-325 MG PO TABS: ORAL_TABLET | ORAL | Status: DC

## 2014-01-08 NOTE — Telephone Encounter (Signed)
Left detailed message on cell# that rx is ready for pick up at the front desk. 

## 2014-01-08 NOTE — Telephone Encounter (Signed)
Last Rx provided for #60 x no refills on 12/07/13.  Rx printed and forwarded to PRovider for signature.

## 2014-02-03 ENCOUNTER — Telehealth: Payer: Self-pay | Admitting: *Deleted

## 2014-02-03 DIAGNOSIS — G8929 Other chronic pain: Secondary | ICD-10-CM

## 2014-02-03 DIAGNOSIS — M542 Cervicalgia: Principal | ICD-10-CM

## 2014-02-03 NOTE — Telephone Encounter (Signed)
Received message from pt requesting refill of hydrocodone.  Rx last printed 01/08/14. Will print Rx on 02/05/14 for pick up.

## 2014-02-05 MED ORDER — HYDROCODONE-ACETAMINOPHEN 5-325 MG PO TABS: ORAL_TABLET | ORAL | Status: DC

## 2014-02-05 NOTE — Telephone Encounter (Signed)
Rx forwarded to front desk for pick up. Left detailed message on pt's cell#.

## 2014-02-05 NOTE — Telephone Encounter (Signed)
Rx printed and forwarded to PRovider for signature. 

## 2014-03-04 ENCOUNTER — Telehealth: Payer: Self-pay | Admitting: Family

## 2014-03-04 DIAGNOSIS — G8929 Other chronic pain: Secondary | ICD-10-CM

## 2014-03-04 DIAGNOSIS — M542 Cervicalgia: Principal | ICD-10-CM

## 2014-03-04 NOTE — Telephone Encounter (Signed)
Patient requesting refill on Norco, call when ready for pick up

## 2014-03-05 MED ORDER — HYDROCODONE-ACETAMINOPHEN 5-325 MG PO TABS: ORAL_TABLET | ORAL | Status: DC

## 2014-03-05 NOTE — Telephone Encounter (Signed)
Notified pt rx has been sent to front desk for pick up.

## 2014-03-05 NOTE — Telephone Encounter (Signed)
Last rx provided 02/05/14. Rx printed and forwarded to PRovider for signature.

## 2014-04-05 ENCOUNTER — Telehealth: Payer: Self-pay | Admitting: *Deleted

## 2014-04-05 DIAGNOSIS — G8929 Other chronic pain: Secondary | ICD-10-CM

## 2014-04-05 DIAGNOSIS — M542 Cervicalgia: Principal | ICD-10-CM

## 2014-04-05 MED ORDER — HYDROCODONE-ACETAMINOPHEN 5-325 MG PO TABS: ORAL_TABLET | ORAL | Status: DC

## 2014-04-05 NOTE — Telephone Encounter (Signed)
Pt left message requesting Rx of hydrocodone. Last rx printed 03/05/14, #60. Pt was last seen by [place] in January for acute illness and no future appointments on file.  Rx printed and forwarded to Provider for signature. Please advise re: follow up.

## 2014-04-05 NOTE — Telephone Encounter (Signed)
OK to print 1 month supply. But pt needs follow up.

## 2014-04-06 NOTE — Telephone Encounter (Signed)
Notified pt that rx has been placed at front desk for pick up and scheduled f/u for 04/26/14 at 2pm.

## 2014-04-26 ENCOUNTER — Ambulatory Visit (INDEPENDENT_AMBULATORY_CARE_PROVIDER_SITE_OTHER): Payer: No Typology Code available for payment source | Admitting: Family

## 2014-04-26 ENCOUNTER — Encounter: Payer: Self-pay | Admitting: Family

## 2014-04-26 VITALS — BP 116/84 | HR 65 | Temp 97.6°F | Resp 16 | Ht 68.0 in | Wt 151.0 lb

## 2014-04-26 DIAGNOSIS — F3289 Other specified depressive episodes: Secondary | ICD-10-CM

## 2014-04-26 DIAGNOSIS — F329 Major depressive disorder, single episode, unspecified: Secondary | ICD-10-CM

## 2014-04-26 DIAGNOSIS — K219 Gastro-esophageal reflux disease without esophagitis: Secondary | ICD-10-CM

## 2014-04-26 DIAGNOSIS — M542 Cervicalgia: Secondary | ICD-10-CM

## 2014-04-26 DIAGNOSIS — G8929 Other chronic pain: Secondary | ICD-10-CM

## 2014-04-26 MED ORDER — HYDROCODONE-ACETAMINOPHEN 5-325 MG PO TABS: ORAL_TABLET | ORAL | Status: DC

## 2014-04-26 MED ORDER — OMEPRAZOLE 40 MG PO CPDR: DELAYED_RELEASE_CAPSULE | ORAL | Status: DC

## 2014-04-26 NOTE — Assessment & Plan Note (Signed)
Stable on prilosec. Continue same . 

## 2014-04-26 NOTE — Assessment & Plan Note (Signed)
States pain is controlled on hydrocodone/acetaminophen 325/5. Will do urine toxicology screen today. FU in 3 months with CPE.

## 2014-04-26 NOTE — Patient Instructions (Addendum)
Please complete lab work prior to leaving.   Follow up in 3 months for complete physical.
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
## 2014-04-26 NOTE — Assessment & Plan Note (Addendum)
Denies depression today. Monitor.

## 2014-04-26 NOTE — Assessment & Plan Note (Signed)
Stable on PPI, continue same.  

## 2014-04-26 NOTE — Progress Notes (Signed)
Pre visit review using our clinic review tool, if applicable. No additional management support is needed unless otherwise documented below in the visit note. 

## 2014-05-10 ENCOUNTER — Encounter: Payer: Self-pay | Admitting: Family

## 2014-05-12 ENCOUNTER — Encounter: Payer: Self-pay | Admitting: Family

## 2014-06-04 ENCOUNTER — Telehealth: Payer: Self-pay

## 2014-06-04 DIAGNOSIS — G8929 Other chronic pain: Secondary | ICD-10-CM

## 2014-06-04 DIAGNOSIS — M542 Cervicalgia: Principal | ICD-10-CM

## 2014-06-04 MED ORDER — HYDROCODONE-ACETAMINOPHEN 5-325 MG PO TABS: ORAL_TABLET | ORAL | Status: DC

## 2014-06-04 NOTE — Telephone Encounter (Signed)
See rx. 

## 2014-06-04 NOTE — Telephone Encounter (Signed)
Left detailed message that Rx has been sent to front desk for pick up.

## 2014-06-04 NOTE — Telephone Encounter (Signed)
Pt called requesting a refill on hydrocodone 5-325 bid prn for pain.  Please advise

## 2014-07-02 ENCOUNTER — Telehealth: Payer: Self-pay | Admitting: Family

## 2014-07-02 DIAGNOSIS — M542 Cervicalgia: Principal | ICD-10-CM

## 2014-07-02 DIAGNOSIS — G8929 Other chronic pain: Secondary | ICD-10-CM

## 2014-07-02 MED ORDER — HYDROCODONE-ACETAMINOPHEN 5-325 MG PO TABS: ORAL_TABLET | ORAL | Status: DC

## 2014-07-02 NOTE — Telephone Encounter (Signed)
Notified pt and he voices understanding. Rx placed at front desk for pick up. 

## 2014-07-02 NOTE — Telephone Encounter (Signed)
Ok to send 1 refill.  Please advise pt that we will be retesting his urine at his next visit in October.  If still + for marijuana I will no longer be able to continue his hydrocodone as this is in violation of the controlled substance contract he signed.

## 2014-07-02 NOTE — Telephone Encounter (Signed)
Caller name: Shuayb Relation to pt: Call back number:[phone]   Reason for call:  Pt is needing a refill on Rx HYDROcodone-acetaminophen (NORCO/VICODIN) 5-325 MG

## 2014-07-02 NOTE — Telephone Encounter (Signed)
See letter of 05/12/14. Please advise.

## 2014-08-04 ENCOUNTER — Telehealth: Payer: Self-pay | Admitting: Family

## 2014-08-04 DIAGNOSIS — G8929 Other chronic pain: Secondary | ICD-10-CM

## 2014-08-04 DIAGNOSIS — M542 Cervicalgia: Principal | ICD-10-CM

## 2014-08-04 MED ORDER — HYDROCODONE-ACETAMINOPHEN 5-325 MG PO TABS: ORAL_TABLET | ORAL | Status: DC

## 2014-08-04 NOTE — Telephone Encounter (Signed)
OK to print refill, however please let pt know that we will need to repeat his urine drug tox when he pick up his hydrocodone rx.

## 2014-08-04 NOTE — Telephone Encounter (Signed)
Refill request for Hydrocodone-APAP 5-325 mg Last filled by MD on - 09.04.15, 360x0 Last AEX - 06.29.15 Next AEX - 3 Mths for CPE [Scheduled for 10.28.15] Please Advise on refills/SLS

## 2014-08-04 NOTE — Telephone Encounter (Signed)
Pt requesting a refill of HYDROcodone-acetaminophen (NORCO/VICODIN) 5-325 MG per tablet

## 2014-08-04 NOTE — Telephone Encounter (Signed)
Notified pt that Rx is ready for pick up and he will need to leave urine drug screen when he picks up Rx.

## 2014-08-24 ENCOUNTER — Encounter: Payer: No Typology Code available for payment source | Admitting: Family

## 2014-08-25 ENCOUNTER — Ambulatory Visit (INDEPENDENT_AMBULATORY_CARE_PROVIDER_SITE_OTHER): Payer: No Typology Code available for payment source | Admitting: Family

## 2014-08-25 ENCOUNTER — Encounter: Payer: Self-pay | Admitting: Family

## 2014-08-25 VITALS — BP 120/88 | HR 70 | Resp 18 | Ht 69.0 in | Wt 147.2 lb

## 2014-08-25 DIAGNOSIS — Z0000 Encounter for general adult medical examination without abnormal findings: Secondary | ICD-10-CM

## 2014-08-25 DIAGNOSIS — M542 Cervicalgia: Secondary | ICD-10-CM

## 2014-08-25 DIAGNOSIS — R195 Other fecal abnormalities: Secondary | ICD-10-CM

## 2014-08-25 LAB — URINALYSIS, ROUTINE W REFLEX MICROSCOPIC
Bilirubin Urine: NEGATIVE
Hgb urine dipstick: NEGATIVE
Ketones, ur: NEGATIVE
Leukocytes, UA: NEGATIVE
Nitrite: NEGATIVE
RBC / HPF: NONE SEEN
Specific Gravity, Urine: <=1.005 — AB
Total Protein, Urine: NEGATIVE
Urine Glucose: NEGATIVE
Urobilinogen, UA: 0.2
WBC, UA: NONE SEEN
pH: 5.5 (ref 5.0–8.0)

## 2014-08-25 LAB — HEPATIC FUNCTION PANEL
ALT: 15 U/L (ref 0–53)
AST: 16 U/L (ref 0–37)
Albumin: 3.5 g/dL (ref 3.5–5.2)
Alkaline Phosphatase: 77 U/L (ref 39–117)
Bilirubin, Direct: 0 mg/dL (ref 0.0–0.3)
Total Bilirubin: 0.7 mg/dL (ref 0.2–1.2)
Total Protein: 6.7 g/dL (ref 6.0–8.3)

## 2014-08-25 LAB — LIPID PANEL
CHOL/HDL RATIO: 3
Cholesterol: 164 mg/dL (ref 0–200)
HDL: 47.9 mg/dL (ref >39.00)
LDL Cholesterol: 95 mg/dL (ref 0–99)
NonHDL: 116.1
Triglycerides: 107 mg/dL (ref 0.0–149.0)
VLDL: 21.4 mg/dL (ref 0.0–40.0)

## 2014-08-25 LAB — CBC WITH DIFFERENTIAL/PLATELET
BASOS PCT: 0.6 % (ref 0.0–3.0)
Basophils Absolute: 0 10*3/uL (ref 0.0–0.1)
Eosinophils Absolute: 0.2 10*3/uL (ref 0.0–0.7)
Eosinophils Relative: 2.5 % (ref 0.0–5.0)
HEMATOCRIT: 45.5 % (ref 39.0–52.0)
HEMOGLOBIN: 15 g/dL (ref 13.0–17.0)
LYMPHS ABS: 1.9 10*3/uL (ref 0.7–4.0)
Lymphocytes Relative: 22.6 % (ref 12.0–46.0)
MCHC: 32.9 g/dL (ref 30.0–36.0)
MCV: 93.9 fl (ref 78.0–100.0)
MONO ABS: 0.8 10*3/uL (ref 0.1–1.0)
MONOS PCT: 9.8 % (ref 3.0–12.0)
NEUTROS ABS: 5.4 10*3/uL (ref 1.4–7.7)
Neutrophils Relative %: 64.5 % (ref 43.0–77.0)
Platelets: 280 10*3/uL (ref 150.0–400.0)
RBC: 4.84 Mil/uL (ref 4.22–5.81)
RDW: 13.6 % (ref 11.5–15.5)
WBC: 8.4 10*3/uL (ref 4.0–10.5)

## 2014-08-25 LAB — BASIC METABOLIC PANEL WITH GFR
BUN: 11 mg/dL (ref 6–23)
CO2: 27 meq/L (ref 19–32)
Calcium: 9.2 mg/dL (ref 8.4–10.5)
Chloride: 102 meq/L (ref 96–112)
Creatinine, Ser: 1 mg/dL (ref 0.4–1.5)
GFR: 84.99 mL/min
Glucose, Bld: 104 mg/dL — ABNORMAL HIGH (ref 70–99)
Potassium: 4.9 meq/L (ref 3.5–5.1)
Sodium: 136 meq/L (ref 135–145)

## 2014-08-25 LAB — TSH: TSH: 1.37 u[IU]/mL (ref 0.35–4.50)
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
## 2014-08-25 NOTE — Patient Instructions (Signed)
Please complete lab work today prior to leaving. Complete stool kit and return by mail. Follow up in 3 months.

## 2014-08-25 NOTE — Progress Notes (Signed)
Pre visit review using our clinic review tool, if applicable. No additional management support is needed unless otherwise documented below in the visit note. 

## 2014-08-25 NOTE — Assessment & Plan Note (Signed)
Pt given rx for hydrocodone (did not pick up Rx on 10/7 and this was given to him today). Due for follow up urine drug tox.

## 2014-08-25 NOTE — Assessment & Plan Note (Signed)
Continue healthy diet, exercise.  Obtain fasting labs. Will also obtain IFOB due to report of several days of dark stools which have now resolved. We did discuss avoiding NSAIDS/ASA containing products.  Has been using [facility].

## 2014-08-25 NOTE — Addendum Note (Signed)
Addended by: Verdie ShireBAYNES, Dyann Goodspeed M on: 08/25/2014 07:57 AM   Modules accepted: Orders

## 2014-08-26 ENCOUNTER — Encounter: Payer: Self-pay | Admitting: Family

## 2014-09-20 ENCOUNTER — Other Ambulatory Visit (INDEPENDENT_AMBULATORY_CARE_PROVIDER_SITE_OTHER): Payer: No Typology Code available for payment source

## 2014-09-20 ENCOUNTER — Encounter: Payer: Self-pay | Admitting: Family

## 2014-09-20 DIAGNOSIS — R195 Other fecal abnormalities: Secondary | ICD-10-CM

## 2014-09-20 LAB — FECAL OCCULT BLOOD, IMMUNOCHEMICAL: Fecal Occult Bld: NEGATIVE

## 2014-09-22 ENCOUNTER — Telehealth: Payer: Self-pay | Admitting: Family

## 2014-09-22 NOTE — Telephone Encounter (Signed)
Caller name: [place] Relation to pt: self Call back number: [phone] Pharmacy:  Reason for call:   Patient is requesting a new hydrocodone rx

## 2014-09-22 NOTE — Telephone Encounter (Signed)
Pt was supposed to have repeated urine drug screen? Not sure if it was completed.  Please advise re: Rx request.

## 2014-09-22 NOTE — Telephone Encounter (Signed)
Per lab records, last UDS obtained 04/2014. It appears pt did not complete drug screen ordered on 08/25/14.  Left detailed message on pt's cell# that rx cannot be given until pt completes UDS and results are received. Advised that pt call the office to schedule lab appt and that the office will be closed until Monday.

## 2014-11-26 ENCOUNTER — Ambulatory Visit: Payer: No Typology Code available for payment source | Admitting: Family

## 2014-12-06 ENCOUNTER — Ambulatory Visit (INDEPENDENT_AMBULATORY_CARE_PROVIDER_SITE_OTHER): Payer: No Typology Code available for payment source | Admitting: Family

## 2014-12-06 ENCOUNTER — Ambulatory Visit (HOSPITAL_BASED_OUTPATIENT_CLINIC_OR_DEPARTMENT_OTHER)
Admission: RE | Admit: 2014-12-06 | Discharge: 2014-12-06 | Disposition: A | Discharge status: Home / Self Care | Payer: No Typology Code available for payment source | Source: Ambulatory Visit | Attending: Family | Admitting: Family

## 2014-12-06 ENCOUNTER — Encounter: Payer: Self-pay | Admitting: Family

## 2014-12-06 VITALS — BP 130/90 | HR 64 | Resp 16 | Ht 68.0 in | Wt 151.6 lb

## 2014-12-06 DIAGNOSIS — M542 Cervicalgia: Secondary | ICD-10-CM | POA: Diagnosis present

## 2014-12-06 DIAGNOSIS — Z981 Arthrodesis status: Secondary | ICD-10-CM | POA: Insufficient documentation

## 2014-12-06 DIAGNOSIS — J069 Acute upper respiratory infection, unspecified: Secondary | ICD-10-CM

## 2014-12-06 DIAGNOSIS — W19XXXA Unspecified fall, initial encounter: Secondary | ICD-10-CM | POA: Insufficient documentation

## 2014-12-06 NOTE — Assessment & Plan Note (Signed)
Patient wishes to remain of hydrocodone and I think this is a good idea.  Will obtain a plain film of his neck to make sure that he does not have any new/acute injury. We did discuss that if he develops any numbness/weakness of the upper extremities he should notify us.

## 2014-12-06 NOTE — Patient Instructions (Signed)
Please complete your x ray on the first floor. Call if cold symptoms do not continue to improve. Follow up in 6 months.
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
## 2014-12-06 NOTE — Progress Notes (Signed)
Pre visit review using our clinic review tool, if applicable. No additional management support is needed unless otherwise documented below in the visit note. 

## 2015-07-08 ENCOUNTER — Telehealth: Payer: Self-pay | Admitting: Family

## 2015-07-08 NOTE — Telephone Encounter (Signed)
Zachary Berg with [facility] Dept Ph# [phone] Fax# [phone]  Received call that questionnaire & medical release form for records of mental health status for pt to have a concealed handgun permit thru [facility] were mailed to our office address on 06/14/15. They have not received a response. She is faxing the request today to [phone]. Please respond asap and return to above fax #.

## 2015-07-11 NOTE — Telephone Encounter (Signed)
w/provider

## 2015-07-12 NOTE — Telephone Encounter (Signed)
Zachary Berg at the number below requesting for them to please fax forms to our back fax at [phone]. [email or web address]

## 2015-07-12 NOTE — Telephone Encounter (Signed)
Correction, these forms were NOT received for this patient by fax nor by mail.

## 2015-07-13 NOTE — Telephone Encounter (Signed)
Form completed and faxed to below # and forwarded to scanning dept.

## 2015-07-13 NOTE — Telephone Encounter (Signed)
I have filled form again.

## 2015-07-13 NOTE — Telephone Encounter (Signed)
Spoke with [facility], they have no record of receiving records request or form for permit.  Please advise.

## 2015-09-12 ENCOUNTER — Ambulatory Visit (INDEPENDENT_AMBULATORY_CARE_PROVIDER_SITE_OTHER): Payer: BLUE CROSS/BLUE SHIELD | Admitting: Family

## 2015-09-12 ENCOUNTER — Encounter: Payer: Self-pay | Admitting: Family

## 2015-09-12 VITALS — BP 134/81 | HR 65 | Temp 97.6°F | Resp 18 | Ht 68.0 in | Wt 150.8 lb

## 2015-09-12 DIAGNOSIS — J209 Acute bronchitis, unspecified: Secondary | ICD-10-CM | POA: Diagnosis not present

## 2015-09-12 MED ORDER — AZITHROMYCIN 250 MG PO TABS: ORAL_TABLET | ORAL | Status: DC

## 2015-09-12 MED ORDER — ALBUTEROL SULFATE HFA 108 (90 BASE) MCG/ACT IN AERS: 2.0000 | INHALATION_SPRAY | Freq: Four times a day (QID) | RESPIRATORY_TRACT | Status: AC | PRN

## 2015-09-12 NOTE — Progress Notes (Signed)
Pre visit review using our clinic review tool, if applicable. No additional management support is needed unless otherwise documented below in the visit note. 

## 2015-09-12 NOTE — Patient Instructions (Signed)
mucinex 600 twice daily as needed for chest/sinus congestion. Start zpak (antibiotic) and albuterol inhaler as needed. Call if symptoms worsen or if not improved in 3 days.
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
## 2017-01-11 ENCOUNTER — Inpatient Hospital Stay (HOSPITAL_COMMUNITY)
Admission: EM | Admit: 2017-01-11 | Discharge: 2017-01-18 | DRG: 280 | Disposition: A | Discharge status: Rehabilitation Facility | Payer: BLUE CROSS/BLUE SHIELD | Attending: General Surgery | Admitting: General Surgery

## 2017-01-11 ENCOUNTER — Inpatient Hospital Stay (HOSPITAL_COMMUNITY): Payer: BLUE CROSS/BLUE SHIELD

## 2017-01-11 ENCOUNTER — Emergency Department (HOSPITAL_COMMUNITY): Payer: BLUE CROSS/BLUE SHIELD

## 2017-01-11 ENCOUNTER — Encounter (HOSPITAL_COMMUNITY): Payer: Self-pay

## 2017-01-11 DIAGNOSIS — S82141A Displaced bicondylar fracture of right tibia, initial encounter for closed fracture: Secondary | ICD-10-CM | POA: Diagnosis present

## 2017-01-11 DIAGNOSIS — S298XXA Other specified injuries of thorax, initial encounter: Secondary | ICD-10-CM

## 2017-01-11 DIAGNOSIS — Z4659 Encounter for fitting and adjustment of other gastrointestinal appliance and device: Secondary | ICD-10-CM

## 2017-01-11 DIAGNOSIS — S92141A Displaced dome fracture of right talus, initial encounter for closed fracture: Secondary | ICD-10-CM | POA: Diagnosis present

## 2017-01-11 DIAGNOSIS — R9431 Abnormal electrocardiogram [ECG] [EKG]: Secondary | ICD-10-CM

## 2017-01-11 DIAGNOSIS — K5900 Constipation, unspecified: Secondary | ICD-10-CM | POA: Diagnosis present

## 2017-01-11 DIAGNOSIS — S062X2S Diffuse traumatic brain injury with loss of consciousness of 31 minutes to 59 minutes, sequela: Secondary | ICD-10-CM | POA: Diagnosis not present

## 2017-01-11 DIAGNOSIS — E8770 Fluid overload, unspecified: Secondary | ICD-10-CM | POA: Diagnosis not present

## 2017-01-11 DIAGNOSIS — S8251XA Displaced fracture of medial malleolus of right tibia, initial encounter for closed fracture: Secondary | ICD-10-CM | POA: Diagnosis present

## 2017-01-11 DIAGNOSIS — S069X2S Unspecified intracranial injury with loss of consciousness of 31 minutes to 59 minutes, sequela: Secondary | ICD-10-CM | POA: Diagnosis not present

## 2017-01-11 DIAGNOSIS — J690 Pneumonitis due to inhalation of food and vomit: Secondary | ICD-10-CM | POA: Diagnosis not present

## 2017-01-11 DIAGNOSIS — M25561 Pain in right knee: Secondary | ICD-10-CM | POA: Diagnosis present

## 2017-01-11 DIAGNOSIS — J9601 Acute respiratory failure with hypoxia: Secondary | ICD-10-CM | POA: Diagnosis present

## 2017-01-11 DIAGNOSIS — D62 Acute posthemorrhagic anemia: Secondary | ICD-10-CM | POA: Diagnosis present

## 2017-01-11 DIAGNOSIS — R938 Abnormal findings on diagnostic imaging of other specified body structures: Secondary | ICD-10-CM

## 2017-01-11 DIAGNOSIS — E785 Hyperlipidemia, unspecified: Secondary | ICD-10-CM | POA: Diagnosis present

## 2017-01-11 DIAGNOSIS — M25572 Pain in left ankle and joints of left foot: Secondary | ICD-10-CM

## 2017-01-11 DIAGNOSIS — F1010 Alcohol abuse, uncomplicated: Secondary | ICD-10-CM | POA: Diagnosis present

## 2017-01-11 DIAGNOSIS — K429 Umbilical hernia without obstruction or gangrene: Secondary | ICD-10-CM | POA: Diagnosis present

## 2017-01-11 DIAGNOSIS — S92251A Displaced fracture of navicular [scaphoid] of right foot, initial encounter for closed fracture: Secondary | ICD-10-CM | POA: Diagnosis present

## 2017-01-11 DIAGNOSIS — I214 Non-ST elevation (NSTEMI) myocardial infarction: Secondary | ICD-10-CM

## 2017-01-11 DIAGNOSIS — I2109 ST elevation (STEMI) myocardial infarction involving other coronary artery of anterior wall: Principal | ICD-10-CM | POA: Diagnosis present

## 2017-01-11 DIAGNOSIS — Y92410 Unspecified street and highway as the place of occurrence of the external cause: Secondary | ICD-10-CM | POA: Diagnosis not present

## 2017-01-11 DIAGNOSIS — S1093XA Contusion of unspecified part of neck, initial encounter: Secondary | ICD-10-CM | POA: Diagnosis present

## 2017-01-11 DIAGNOSIS — S52255S Nondisplaced comminuted fracture of shaft of ulna, left arm, sequela: Secondary | ICD-10-CM | POA: Diagnosis not present

## 2017-01-11 DIAGNOSIS — S2691XA Contusion of heart, unspecified with or without hemopericardium, initial encounter: Secondary | ICD-10-CM | POA: Diagnosis present

## 2017-01-11 DIAGNOSIS — M7989 Other specified soft tissue disorders: Secondary | ICD-10-CM | POA: Diagnosis not present

## 2017-01-11 DIAGNOSIS — S8254XS Nondisplaced fracture of medial malleolus of right tibia, sequela: Secondary | ICD-10-CM | POA: Diagnosis not present

## 2017-01-11 DIAGNOSIS — S27329A Contusion of lung, unspecified, initial encounter: Secondary | ICD-10-CM | POA: Diagnosis present

## 2017-01-11 DIAGNOSIS — J9690 Respiratory failure, unspecified, unspecified whether with hypoxia or hypercapnia: Secondary | ICD-10-CM

## 2017-01-11 DIAGNOSIS — Z8711 Personal history of peptic ulcer disease: Secondary | ICD-10-CM

## 2017-01-11 DIAGNOSIS — S2220XA Unspecified fracture of sternum, initial encounter for closed fracture: Secondary | ICD-10-CM | POA: Diagnosis present

## 2017-01-11 DIAGNOSIS — S92253A Displaced fracture of navicular [scaphoid] of unspecified foot, initial encounter for closed fracture: Secondary | ICD-10-CM

## 2017-01-11 DIAGNOSIS — S82899A Other fracture of unspecified lower leg, initial encounter for closed fracture: Secondary | ICD-10-CM

## 2017-01-11 DIAGNOSIS — S83511A Sprain of anterior cruciate ligament of right knee, initial encounter: Secondary | ICD-10-CM | POA: Diagnosis present

## 2017-01-11 DIAGNOSIS — S2242XD Multiple fractures of ribs, left side, subsequent encounter for fracture with routine healing: Secondary | ICD-10-CM | POA: Diagnosis not present

## 2017-01-11 DIAGNOSIS — I4901 Ventricular fibrillation: Secondary | ICD-10-CM | POA: Diagnosis not present

## 2017-01-11 DIAGNOSIS — Y908 Blood alcohol level of 240 mg/100 ml or more: Secondary | ICD-10-CM | POA: Diagnosis present

## 2017-01-11 DIAGNOSIS — S52255D Nondisplaced comminuted fracture of shaft of ulna, left arm, subsequent encounter for closed fracture with routine healing: Secondary | ICD-10-CM | POA: Diagnosis not present

## 2017-01-11 DIAGNOSIS — F1210 Cannabis abuse, uncomplicated: Secondary | ICD-10-CM | POA: Diagnosis present

## 2017-01-11 DIAGNOSIS — G9340 Encephalopathy, unspecified: Secondary | ICD-10-CM | POA: Diagnosis not present

## 2017-01-11 DIAGNOSIS — I469 Cardiac arrest, cause unspecified: Secondary | ICD-10-CM | POA: Diagnosis present

## 2017-01-11 DIAGNOSIS — I472 Ventricular tachycardia: Secondary | ICD-10-CM | POA: Diagnosis present

## 2017-01-11 DIAGNOSIS — S069X0A Unspecified intracranial injury without loss of consciousness, initial encounter: Secondary | ICD-10-CM | POA: Diagnosis present

## 2017-01-11 DIAGNOSIS — S82131A Displaced fracture of medial condyle of right tibia, initial encounter for closed fracture: Secondary | ICD-10-CM | POA: Diagnosis not present

## 2017-01-11 DIAGNOSIS — T148XXA Other injury of unspecified body region, initial encounter: Secondary | ICD-10-CM

## 2017-01-11 DIAGNOSIS — G8929 Other chronic pain: Secondary | ICD-10-CM | POA: Diagnosis present

## 2017-01-11 DIAGNOSIS — S92254A Nondisplaced fracture of navicular [scaphoid] of right foot, initial encounter for closed fracture: Secondary | ICD-10-CM

## 2017-01-11 DIAGNOSIS — I10 Essential (primary) hypertension: Secondary | ICD-10-CM | POA: Diagnosis present

## 2017-01-11 DIAGNOSIS — Z8249 Family history of ischemic heart disease and other diseases of the circulatory system: Secondary | ICD-10-CM

## 2017-01-11 DIAGNOSIS — Z981 Arthrodesis status: Secondary | ICD-10-CM

## 2017-01-11 DIAGNOSIS — Z79899 Other long term (current) drug therapy: Secondary | ICD-10-CM | POA: Diagnosis not present

## 2017-01-11 DIAGNOSIS — F10129 Alcohol abuse with intoxication, unspecified: Secondary | ICD-10-CM | POA: Diagnosis present

## 2017-01-11 DIAGNOSIS — Z978 Presence of other specified devices: Secondary | ICD-10-CM

## 2017-01-11 DIAGNOSIS — I2102 ST elevation (STEMI) myocardial infarction involving left anterior descending coronary artery: Secondary | ICD-10-CM | POA: Diagnosis not present

## 2017-01-11 DIAGNOSIS — M25571 Pain in right ankle and joints of right foot: Secondary | ICD-10-CM

## 2017-01-11 DIAGNOSIS — Z720 Tobacco use: Secondary | ICD-10-CM

## 2017-01-11 DIAGNOSIS — S069X0D Unspecified intracranial injury without loss of consciousness, subsequent encounter: Secondary | ICD-10-CM | POA: Diagnosis not present

## 2017-01-11 DIAGNOSIS — S72421A Displaced fracture of lateral condyle of right femur, initial encounter for closed fracture: Secondary | ICD-10-CM | POA: Diagnosis present

## 2017-01-11 DIAGNOSIS — I2510 Atherosclerotic heart disease of native coronary artery without angina pectoris: Secondary | ICD-10-CM | POA: Diagnosis not present

## 2017-01-11 DIAGNOSIS — S2691XD Contusion of heart, unspecified with or without hemopericardium, subsequent encounter: Secondary | ICD-10-CM | POA: Diagnosis not present

## 2017-01-11 DIAGNOSIS — I4510 Unspecified right bundle-branch block: Secondary | ICD-10-CM | POA: Diagnosis present

## 2017-01-11 DIAGNOSIS — E876 Hypokalemia: Secondary | ICD-10-CM | POA: Diagnosis present

## 2017-01-11 DIAGNOSIS — S52202A Unspecified fracture of shaft of left ulna, initial encounter for closed fracture: Secondary | ICD-10-CM | POA: Diagnosis not present

## 2017-01-11 DIAGNOSIS — K219 Gastro-esophageal reflux disease without esophagitis: Secondary | ICD-10-CM | POA: Diagnosis present

## 2017-01-11 DIAGNOSIS — S52252D Displaced comminuted fracture of shaft of ulna, left arm, subsequent encounter for closed fracture with routine healing: Secondary | ICD-10-CM | POA: Diagnosis not present

## 2017-01-11 DIAGNOSIS — S8251XD Displaced fracture of medial malleolus of right tibia, subsequent encounter for closed fracture with routine healing: Secondary | ICD-10-CM | POA: Diagnosis not present

## 2017-01-11 DIAGNOSIS — S82141D Displaced bicondylar fracture of right tibia, subsequent encounter for closed fracture with routine healing: Secondary | ICD-10-CM | POA: Diagnosis not present

## 2017-01-11 DIAGNOSIS — M25471 Effusion, right ankle: Secondary | ICD-10-CM

## 2017-01-11 DIAGNOSIS — Z888 Allergy status to other drugs, medicaments and biological substances status: Secondary | ICD-10-CM | POA: Diagnosis not present

## 2017-01-11 DIAGNOSIS — Z9289 Personal history of other medical treatment: Secondary | ICD-10-CM

## 2017-01-11 DIAGNOSIS — S299XXA Unspecified injury of thorax, initial encounter: Secondary | ICD-10-CM

## 2017-01-11 HISTORY — DX: Gastro-esophageal reflux disease without esophagitis: K21.9

## 2017-01-11 HISTORY — DX: Other chronic pain: G89.29

## 2017-01-11 HISTORY — DX: Melena: K92.1

## 2017-01-11 HISTORY — DX: Peptic ulcer, site unspecified, unspecified as acute or chronic, without hemorrhage or perforation: K27.9

## 2017-01-11 LAB — COMPREHENSIVE METABOLIC PANEL
ALBUMIN: 4.1 g/dL (ref 3.5–5.0)
ALK PHOS: 69 U/L (ref 38–126)
ALT: 67 U/L — ABNORMAL HIGH (ref 17–63)
ANION GAP: 11 (ref 5–15)
AST: 86 U/L — ABNORMAL HIGH (ref 15–41)
BILIRUBIN TOTAL: 1.1 mg/dL (ref 0.3–1.2)
BUN: 12 mg/dL (ref 6–20)
CALCIUM: 8.8 mg/dL — AB (ref 8.9–10.3)
CO2: 19 mmol/L — ABNORMAL LOW (ref 22–32)
Chloride: 107 mmol/L (ref 101–111)
Creatinine, Ser: 1.12 mg/dL (ref 0.61–1.24)
GFR calc Af Amer: >60 mL/min (ref >60)
GFR calc non Af Amer: >60 mL/min (ref >60)
GLUCOSE: 141 mg/dL — AB (ref 65–99)
Potassium: 4.2 mmol/L (ref 3.5–5.1)
Sodium: 137 mmol/L (ref 135–145)
TOTAL PROTEIN: 6.5 g/dL (ref 6.5–8.1)

## 2017-01-11 LAB — BPAM FFP
BLOOD PRODUCT EXPIRATION DATE: 201803312359
Blood Product Expiration Date: 201803312359
ISSUE DATE / TIME: 201803161753
ISSUE DATE / TIME: 201803161753
UNIT TYPE AND RH: 6200
Unit Type and Rh: 6200

## 2017-01-11 LAB — I-STAT CHEM 8, ED
BUN: 16 mg/dL (ref 6–20)
CALCIUM ION: 0.99 mmol/L — AB (ref 1.15–1.40)
CHLORIDE: 105 mmol/L (ref 101–111)
Creatinine, Ser: 1.5 mg/dL — ABNORMAL HIGH (ref 0.61–1.24)
Glucose, Bld: 136 mg/dL — ABNORMAL HIGH (ref 65–99)
HCT: 49 % (ref 39.0–52.0)
Hemoglobin: 16.7 g/dL (ref 13.0–17.0)
Potassium: 4.5 mmol/L (ref 3.5–5.1)
SODIUM: 138 mmol/L (ref 135–145)
TCO2: 23 mmol/L (ref 0–100)

## 2017-01-11 LAB — PREPARE FRESH FROZEN PLASMA
Unit division: 0
Unit division: 0

## 2017-01-11 LAB — I-STAT TROPONIN, ED: TROPONIN I, POC: 0.69 ng/mL — AB (ref 0.00–0.08)

## 2017-01-11 LAB — CBC
HCT: 47.3 % (ref 39.0–52.0)
HEMOGLOBIN: 16.5 g/dL (ref 13.0–17.0)
MCH: 31.7 pg (ref 26.0–34.0)
MCHC: 34.9 g/dL (ref 30.0–36.0)
MCV: 90.8 fL (ref 78.0–100.0)
PLATELETS: 266 10*3/uL (ref 150–400)
RBC: 5.21 MIL/uL (ref 4.22–5.81)
RDW: 12.6 % (ref 11.5–15.5)
WBC: 15.3 10*3/uL — ABNORMAL HIGH (ref 4.0–10.5)

## 2017-01-11 LAB — I-STAT CG4 LACTIC ACID, ED: Lactic Acid, Venous: 3.03 mmol/L (ref 0.5–1.9)

## 2017-01-11 LAB — PROTIME-INR
INR: 1.13
PROTHROMBIN TIME: 14.5 s (ref 11.4–15.2)

## 2017-01-11 LAB — I-STAT ARTERIAL BLOOD GAS, ED
ACID-BASE DEFICIT: 12 mmol/L — AB (ref 0.0–2.0)
BICARBONATE: 17.5 mmol/L — AB (ref 20.0–28.0)
O2 Saturation: 100 %
TCO2: 19 mmol/L (ref 0–100)
pCO2 arterial: 49.8 mmHg — ABNORMAL HIGH (ref 32.0–48.0)
pH, Arterial: 7.144 — CL (ref 7.350–7.450)
pO2, Arterial: 345 mmHg — ABNORMAL HIGH (ref 83.0–108.0)

## 2017-01-11 LAB — RAPID URINE DRUG SCREEN, HOSP PERFORMED
AMPHETAMINES: NOT DETECTED
Barbiturates: NOT DETECTED
Benzodiazepines: NOT DETECTED
Cocaine: NOT DETECTED
OPIATES: NOT DETECTED
TETRAHYDROCANNABINOL: POSITIVE — AB

## 2017-01-11 LAB — GLUCOSE, CAPILLARY: Glucose-Capillary: 121 mg/dL — ABNORMAL HIGH (ref 65–99)

## 2017-01-11 LAB — URINALYSIS, ROUTINE W REFLEX MICROSCOPIC
Bacteria, UA: NONE SEEN
Bilirubin Urine: NEGATIVE
Glucose, UA: 50 mg/dL — AB
Ketones, ur: NEGATIVE mg/dL
Leukocytes, UA: NEGATIVE
Nitrite: NEGATIVE
PH: 5 (ref 5.0–8.0)
Protein, ur: 30 mg/dL — AB
SPECIFIC GRAVITY, URINE: 1.018 (ref 1.005–1.030)

## 2017-01-11 LAB — ETHANOL: Alcohol, Ethyl (B): 289 mg/dL — ABNORMAL HIGH (ref <5)

## 2017-01-11 LAB — CDS SEROLOGY

## 2017-01-11 LAB — MRSA PCR SCREENING: MRSA by PCR: NEGATIVE

## 2017-01-11 LAB — TRIGLYCERIDES: Triglycerides: 393 mg/dL — ABNORMAL HIGH (ref <150)

## 2017-01-11 LAB — ABO/RH: ABO/RH(D): A POS

## 2017-01-11 MED ORDER — MIDAZOLAM HCL 2 MG/2ML IJ SOLN
2.0000 mg | INTRAMUSCULAR | Status: DC | PRN
  Administered 2017-01-13: 2 mg via INTRAVENOUS
  Filled 2017-01-11: qty 2

## 2017-01-11 MED ORDER — CHLORHEXIDINE GLUCONATE 0.12% ORAL RINSE (MEDLINE KIT)
15.0000 mL | Freq: Two times a day (BID) | OROMUCOSAL | Status: DC
  Administered 2017-01-11 – 2017-01-14 (×6): 15 mL via OROMUCOSAL

## 2017-01-11 MED ORDER — FENTANYL CITRATE (PF) 100 MCG/2ML IJ SOLN
100.0000 ug | INTRAMUSCULAR | Status: DC | PRN
  Administered 2017-01-11: 100 ug via INTRAVENOUS
  Filled 2017-01-11: qty 2

## 2017-01-11 MED ORDER — PROPOFOL 1000 MG/100ML IV EMUL
5.0000 ug/kg/min | INTRAVENOUS | Status: DC
  Administered 2017-01-11: 5 ug/kg/min via INTRAVENOUS
  Administered 2017-01-12 (×2): 20 ug/kg/min via INTRAVENOUS
  Filled 2017-01-11 (×4): qty 100

## 2017-01-11 MED ORDER — FENTANYL CITRATE (PF) 100 MCG/2ML IJ SOLN
100.0000 ug | INTRAMUSCULAR | Status: DC | PRN
  Administered 2017-01-11 (×2): 100 ug via INTRAVENOUS
  Filled 2017-01-11 (×2): qty 2

## 2017-01-11 MED ORDER — ENOXAPARIN SODIUM 40 MG/0.4ML ~~LOC~~ SOLN
40.0000 mg | SUBCUTANEOUS | Status: DC
  Administered 2017-01-12 (×2): 40 mg via SUBCUTANEOUS
  Filled 2017-01-11 (×2): qty 0.4

## 2017-01-11 MED ORDER — FENTANYL BOLUS VIA INFUSION
50.0000 ug | INTRAVENOUS | Status: DC | PRN
  Administered 2017-01-12 – 2017-01-13 (×2): 50 ug via INTRAVENOUS
  Filled 2017-01-11: qty 50

## 2017-01-11 MED ORDER — ORAL CARE MOUTH RINSE
15.0000 mL | Freq: Four times a day (QID) | OROMUCOSAL | Status: DC
  Administered 2017-01-12 – 2017-01-14 (×12): 15 mL via OROMUCOSAL

## 2017-01-11 MED ORDER — ETOMIDATE 2 MG/ML IV SOLN
INTRAVENOUS | Status: AC | PRN
  Administered 2017-01-11: 20 mg via INTRAVENOUS

## 2017-01-11 MED ORDER — PROPOFOL 1000 MG/100ML IV EMUL
INTRAVENOUS | Status: AC
  Filled 2017-01-11: qty 100

## 2017-01-11 MED ORDER — SODIUM CHLORIDE 0.9 % IV BOLUS (SEPSIS)
1000.0000 mL | Freq: Once | INTRAVENOUS | Status: AC
  Administered 2017-01-11: 1000 mL via INTRAVENOUS

## 2017-01-11 MED ORDER — MIDAZOLAM HCL 2 MG/2ML IJ SOLN
2.0000 mg | INTRAMUSCULAR | Status: DC | PRN
  Administered 2017-01-14 (×2): 2 mg via INTRAVENOUS
  Filled 2017-01-11: qty 2

## 2017-01-11 MED ORDER — ONDANSETRON HCL 4 MG/2ML IJ SOLN: 4.0000 mg | Freq: Four times a day (QID) | INTRAMUSCULAR | Status: DC | PRN

## 2017-01-11 MED ORDER — FENTANYL 2500MCG IN NS 250ML (10MCG/ML) PREMIX INFUSION
25.0000 ug/h | INTRAVENOUS | Status: DC
  Administered 2017-01-11: 75 ug/h via INTRAVENOUS
  Administered 2017-01-12: 150 ug/h via INTRAVENOUS
  Administered 2017-01-13: 125 ug/h via INTRAVENOUS
  Administered 2017-01-13: 150 ug/h via INTRAVENOUS
  Administered 2017-01-14: 125 ug/h via INTRAVENOUS
  Filled 2017-01-11 (×6): qty 250

## 2017-01-11 MED ORDER — FENTANYL CITRATE (PF) 100 MCG/2ML IJ SOLN: 50.0000 ug | Freq: Once | INTRAMUSCULAR | Status: DC

## 2017-01-11 MED ORDER — TETANUS-DIPHTH-ACELL PERTUSSIS 5-2.5-18.5 LF-MCG/0.5 IM SUSP
INTRAMUSCULAR | Status: AC
  Filled 2017-01-11: qty 0.5

## 2017-01-11 MED ORDER — ROCURONIUM BROMIDE 50 MG/5ML IV SOLN
INTRAVENOUS | Status: AC | PRN
  Administered 2017-01-11: 80 mg via INTRAVENOUS

## 2017-01-11 MED ORDER — TETANUS-DIPHTH-ACELL PERTUSSIS 5-2.5-18.5 LF-MCG/0.5 IM SUSP
0.5000 mL | Freq: Once | INTRAMUSCULAR | Status: AC
  Administered 2017-01-11: 0.5 mL via INTRAMUSCULAR

## 2017-01-11 MED ORDER — DEXTROSE-NACL 5-0.9 % IV SOLN
INTRAVENOUS | Status: DC
  Administered 2017-01-11: 21:00:00 via INTRAVENOUS
  Administered 2017-01-12: 125 mL/h via INTRAVENOUS
  Administered 2017-01-12 (×2): via INTRAVENOUS
  Administered 2017-01-13: 125 mL/h via INTRAVENOUS
  Administered 2017-01-13 – 2017-01-15 (×4): via INTRAVENOUS

## 2017-01-11 MED ORDER — ONDANSETRON HCL 4 MG PO TABS: 4.0000 mg | ORAL_TABLET | Freq: Four times a day (QID) | ORAL | Status: DC | PRN

## 2017-01-11 NOTE — ED Notes (Signed)
Pt transported to CT 3 at this time
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
## 2017-01-11 NOTE — ED Notes (Signed)
Family at bedside and Dr. Derrell Lollingamirez providing pts wife with update

## 2017-01-11 NOTE — ED Notes (Signed)
Portable xray at bedside for post intubation
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
## 2017-01-11 NOTE — Progress Notes (Signed)
ABG collected  
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
## 2017-01-11 NOTE — ED Notes (Signed)
Respiratory called to help with transport to [facility]
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
## 2017-01-12 ENCOUNTER — Inpatient Hospital Stay (HOSPITAL_COMMUNITY): Payer: BLUE CROSS/BLUE SHIELD

## 2017-01-12 DIAGNOSIS — S2691XD Contusion of heart, unspecified with or without hemopericardium, subsequent encounter: Secondary | ICD-10-CM

## 2017-01-12 LAB — CBC WITH DIFFERENTIAL/PLATELET
Basophils Absolute: 0 10*3/uL (ref 0.0–0.1)
Basophils Relative: 0 %
EOS ABS: 0 10*3/uL (ref 0.0–0.7)
Eosinophils Relative: 0 %
HEMATOCRIT: 39.8 % (ref 39.0–52.0)
HEMOGLOBIN: 13.5 g/dL (ref 13.0–17.0)
LYMPHS ABS: 1.3 10*3/uL (ref 0.7–4.0)
Lymphocytes Relative: 11 %
MCH: 31 pg (ref 26.0–34.0)
MCHC: 33.9 g/dL (ref 30.0–36.0)
MCV: 91.5 fL (ref 78.0–100.0)
Monocytes Absolute: 1.6 10*3/uL — ABNORMAL HIGH (ref 0.1–1.0)
Monocytes Relative: 14 %
NEUTROS ABS: 9.2 10*3/uL — AB (ref 1.7–7.7)
NEUTROS PCT: 75 %
Platelets: 172 10*3/uL (ref 150–400)
RBC: 4.35 MIL/uL (ref 4.22–5.81)
RDW: 13.1 % (ref 11.5–15.5)
WBC: 12.1 10*3/uL — AB (ref 4.0–10.5)

## 2017-01-12 LAB — TROPONIN I
Troponin I: >65 ng/mL (ref <0.03)
Troponin I: 33.89 ng/mL (ref <0.03)
Troponin I: 33.26 ng/mL (ref <0.03)

## 2017-01-12 LAB — COMPREHENSIVE METABOLIC PANEL
ALBUMIN: 3.2 g/dL — AB (ref 3.5–5.0)
ALK PHOS: 76 U/L (ref 38–126)
ALT: 209 U/L — AB (ref 17–63)
AST: 340 U/L — ABNORMAL HIGH (ref 15–41)
Anion gap: 10 (ref 5–15)
BUN: 10 mg/dL (ref 6–20)
CALCIUM: 7.8 mg/dL — AB (ref 8.9–10.3)
CO2: 19 mmol/L — AB (ref 22–32)
CREATININE: 0.94 mg/dL (ref 0.61–1.24)
Chloride: 109 mmol/L (ref 101–111)
GFR calc non Af Amer: >60 mL/min (ref >60)
GLUCOSE: 132 mg/dL — AB (ref 65–99)
Potassium: 4.2 mmol/L (ref 3.5–5.1)
SODIUM: 138 mmol/L (ref 135–145)
Total Bilirubin: 0.6 mg/dL (ref 0.3–1.2)
Total Protein: 5.1 g/dL — ABNORMAL LOW (ref 6.5–8.1)

## 2017-01-12 LAB — HIV ANTIBODY (ROUTINE TESTING W REFLEX): HIV Screen 4th Generation wRfx: NONREACTIVE

## 2017-01-12 MED ORDER — AMIODARONE HCL IN DEXTROSE 360-4.14 MG/200ML-% IV SOLN
60.0000 mg/h | INTRAVENOUS | Status: AC
  Administered 2017-01-12: 60 mg/h via INTRAVENOUS
  Filled 2017-01-12 (×2): qty 200

## 2017-01-12 MED ORDER — AMIODARONE HCL IN DEXTROSE 360-4.14 MG/200ML-% IV SOLN
30.0000 mg/h | INTRAVENOUS | Status: DC
  Administered 2017-01-12 – 2017-01-14 (×6): 30 mg/h via INTRAVENOUS
  Filled 2017-01-12 (×6): qty 200
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
## 2017-01-12 NOTE — Progress Notes (Signed)
Right ankle with swelling and bruising. Patient able to move foot and ankle slightly with increased facial grimacing. Patient nods head "yes" when asked if his ankle is painful. Dr. Lindie SpruceWyatt notified and order obtained for xray.
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
## 2017-01-12 NOTE — Progress Notes (Addendum)
Pts. Temperature reported to Dr. Lindie SpruceWyatt. No orders received. Also aware of 2nd Troponin level.
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
## 2017-01-13 ENCOUNTER — Inpatient Hospital Stay (HOSPITAL_COMMUNITY): Payer: BLUE CROSS/BLUE SHIELD

## 2017-01-13 DIAGNOSIS — I469 Cardiac arrest, cause unspecified: Secondary | ICD-10-CM

## 2017-01-13 DIAGNOSIS — I2102 ST elevation (STEMI) myocardial infarction involving left anterior descending coronary artery: Secondary | ICD-10-CM

## 2017-01-13 DIAGNOSIS — I2510 Atherosclerotic heart disease of native coronary artery without angina pectoris: Secondary | ICD-10-CM

## 2017-01-13 LAB — BASIC METABOLIC PANEL
Anion gap: 8 (ref 5–15)
BUN: 11 mg/dL (ref 6–20)
CHLORIDE: 109 mmol/L (ref 101–111)
CO2: 22 mmol/L (ref 22–32)
Calcium: 7.7 mg/dL — ABNORMAL LOW (ref 8.9–10.3)
Creatinine, Ser: 0.99 mg/dL (ref 0.61–1.24)
GFR calc Af Amer: >60 mL/min (ref >60)
GFR calc non Af Amer: >60 mL/min (ref >60)
Glucose, Bld: 123 mg/dL — ABNORMAL HIGH (ref 65–99)
POTASSIUM: 4.1 mmol/L (ref 3.5–5.1)
Sodium: 139 mmol/L (ref 135–145)

## 2017-01-13 LAB — CBC
HEMATOCRIT: 31 % — AB (ref 39.0–52.0)
Hemoglobin: 10.8 g/dL — ABNORMAL LOW (ref 13.0–17.0)
MCH: 31.7 pg (ref 26.0–34.0)
MCHC: 34.8 g/dL (ref 30.0–36.0)
MCV: 90.9 fL (ref 78.0–100.0)
Platelets: 98 10*3/uL — ABNORMAL LOW (ref 150–400)
RBC: 3.41 MIL/uL — ABNORMAL LOW (ref 4.22–5.81)
RDW: 12.8 % (ref 11.5–15.5)
WBC: 11.5 10*3/uL — ABNORMAL HIGH (ref 4.0–10.5)

## 2017-01-13 LAB — HEPARIN LEVEL (UNFRACTIONATED): Heparin Unfractionated: <0.1 [IU]/mL — ABNORMAL LOW (ref 0.30–0.70)

## 2017-01-13 LAB — ECHOCARDIOGRAM COMPLETE
HEIGHTINCHES: 69 in
WEIGHTICAEL: 2744.29 [oz_av]

## 2017-01-13 MED ORDER — SODIUM CHLORIDE 0.9% FLUSH
3.0000 mL | Freq: Two times a day (BID) | INTRAVENOUS | Status: DC
  Administered 2017-01-14: 3 mL via INTRAVENOUS

## 2017-01-13 MED ORDER — ATORVASTATIN CALCIUM 80 MG PO TABS
80.0000 mg | ORAL_TABLET | Freq: Every day | ORAL | Status: DC
  Administered 2017-01-13 – 2017-01-17 (×5): 80 mg via ORAL
  Filled 2017-01-13 (×5): qty 1

## 2017-01-13 MED ORDER — PERFLUTREN LIPID MICROSPHERE
INTRAVENOUS | Status: AC
  Administered 2017-01-13: 2 mL
  Filled 2017-01-13: qty 10

## 2017-01-13 MED ORDER — PERFLUTREN LIPID MICROSPHERE
1.0000 mL | INTRAVENOUS | Status: AC | PRN
  Filled 2017-01-13: qty 10

## 2017-01-13 MED ORDER — SODIUM CHLORIDE 0.9 % WEIGHT BASED INFUSION
1.0000 mL/kg/h | INTRAVENOUS | Status: DC
  Administered 2017-01-14: 1 mL/kg/h via INTRAVENOUS

## 2017-01-13 MED ORDER — HEPARIN (PORCINE) IN NACL 100-0.45 UNIT/ML-% IJ SOLN
1000.0000 [IU]/kg/h | INTRAMUSCULAR | Status: DC
  Filled 2017-01-13: qty 250

## 2017-01-13 MED ORDER — SODIUM CHLORIDE 0.9% FLUSH: 3.0000 mL | INTRAVENOUS | Status: DC | PRN

## 2017-01-13 MED ORDER — HEPARIN (PORCINE) IN NACL 100-0.45 UNIT/ML-% IJ SOLN
1500.0000 [IU]/h | INTRAMUSCULAR | Status: DC
  Administered 2017-01-13: 1000 [IU]/h via INTRAVENOUS

## 2017-01-13 MED ORDER — ASPIRIN 81 MG PO CHEW
81.0000 mg | CHEWABLE_TABLET | Freq: Every day | ORAL | Status: DC
  Administered 2017-01-13 – 2017-01-18 (×5): 81 mg via ORAL
  Filled 2017-01-13 (×5): qty 1

## 2017-01-13 MED ORDER — ASPIRIN 81 MG PO CHEW
81.0000 mg | CHEWABLE_TABLET | ORAL | Status: AC
  Administered 2017-01-14: 81 mg via ORAL
  Filled 2017-01-13: qty 1

## 2017-01-13 MED ORDER — SODIUM CHLORIDE 0.9 % WEIGHT BASED INFUSION
3.0000 mL/kg/h | INTRAVENOUS | Status: DC
  Administered 2017-01-14: 3 mL/kg/h via INTRAVENOUS

## 2017-01-13 MED ORDER — SODIUM CHLORIDE 0.9 % IV SOLN: 250.0000 mL | INTRAVENOUS | Status: DC | PRN

## 2017-01-13 MED ORDER — VANCOMYCIN HCL IN DEXTROSE 750-5 MG/150ML-% IV SOLN
750.0000 mg | Freq: Three times a day (TID) | INTRAVENOUS | Status: DC
  Administered 2017-01-13 – 2017-01-14 (×4): 750 mg via INTRAVENOUS
  Filled 2017-01-13 (×5): qty 150

## 2017-01-13 MED ORDER — ATORVASTATIN CALCIUM 80 MG PO TABS: 80.0000 mg | ORAL_TABLET | Freq: Every day | ORAL | Status: DC

## 2017-01-13 MED ORDER — PIPERACILLIN-TAZOBACTAM 3.375 G IVPB
3.3750 g | Freq: Three times a day (TID) | INTRAVENOUS | Status: DC
  Administered 2017-01-13 – 2017-01-15 (×7): 3.375 g via INTRAVENOUS
  Filled 2017-01-13 (×8): qty 50
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
## 2017-01-13 NOTE — Progress Notes (Signed)
  Echocardiogram 2D Echocardiogram with Definity has been performed.  Nolon RodBrown, Tony 01/13/2017, 2:05 PM
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
## 2017-01-13 NOTE — Progress Notes (Signed)
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 WBC elevation.  Possible aspiration with some RLL infiltrate.  Would send blood and sputum cultures and cover with vancomycin/Zosyn for now.   Loralie Champagne 01/13/2017 8:55 AM

## 2017-01-14 ENCOUNTER — Encounter (HOSPITAL_COMMUNITY): Payer: Self-pay | Admitting: Cardiovascular Disease

## 2017-01-14 ENCOUNTER — Inpatient Hospital Stay (HOSPITAL_COMMUNITY): Payer: BLUE CROSS/BLUE SHIELD

## 2017-01-14 ENCOUNTER — Encounter (HOSPITAL_COMMUNITY): Admission: EM | Disposition: A | Discharge status: Rehabilitation Facility | Payer: Self-pay | Source: Home / Self Care

## 2017-01-14 DIAGNOSIS — Z8249 Family history of ischemic heart disease and other diseases of the circulatory system: Secondary | ICD-10-CM

## 2017-01-14 DIAGNOSIS — I214 Non-ST elevation (NSTEMI) myocardial infarction: Secondary | ICD-10-CM

## 2017-01-14 DIAGNOSIS — I2109 ST elevation (STEMI) myocardial infarction involving other coronary artery of anterior wall: Principal | ICD-10-CM

## 2017-01-14 HISTORY — PX: CORONARY ANGIOGRAPHY: CATH118303

## 2017-01-14 LAB — CBC
HCT: 24.9 % — ABNORMAL LOW (ref 39.0–52.0)
HCT: 17.4 % — ABNORMAL LOW (ref 39.0–52.0)
Hemoglobin: 8.4 g/dL — ABNORMAL LOW (ref 13.0–17.0)
Hemoglobin: 5.5 g/dL — CL (ref 13.0–17.0)
MCH: 30.7 pg (ref 26.0–34.0)
MCH: 31.4 pg (ref 26.0–34.0)
MCHC: 33.7 g/dL (ref 30.0–36.0)
MCHC: 31.6 g/dL (ref 30.0–36.0)
MCV: 90.9 fL (ref 78.0–100.0)
MCV: 99.4 fL (ref 78.0–100.0)
PLATELETS: 102 10*3/uL — AB (ref 150–400)
Platelets: 73 10*3/uL — ABNORMAL LOW (ref 150–400)
RBC: 2.74 MIL/uL — AB (ref 4.22–5.81)
RBC: 1.75 MIL/uL — AB (ref 4.22–5.81)
RDW: 12.8 % (ref 11.5–15.5)
RDW: 13.8 % (ref 11.5–15.5)
WBC: 8 10*3/uL (ref 4.0–10.5)
WBC: 4.6 10*3/uL (ref 4.0–10.5)

## 2017-01-14 LAB — PREPARE RBC (CROSSMATCH)

## 2017-01-14 LAB — HEPARIN LEVEL (UNFRACTIONATED)
HEPARIN UNFRACTIONATED: 0.11 [IU]/mL — AB (ref 0.30–0.70)
Heparin Unfractionated: <0.1 IU/mL — ABNORMAL LOW (ref 0.30–0.70)

## 2017-01-14 LAB — GLUCOSE, CAPILLARY: GLUCOSE-CAPILLARY: 110 mg/dL — AB (ref 65–99)

## 2017-01-14 SURGERY — CORONARY ANGIOGRAPHY (CATH LAB)
Anesthesia: LOCAL

## 2017-01-14 MED ORDER — HEPARIN (PORCINE) IN NACL 2-0.9 UNIT/ML-% IJ SOLN
INTRAMUSCULAR | Status: AC
  Filled 2017-01-14: qty 1000

## 2017-01-14 MED ORDER — HYDRALAZINE HCL 20 MG/ML IJ SOLN
20.0000 mg | Freq: Four times a day (QID) | INTRAMUSCULAR | Status: DC | PRN
  Administered 2017-01-14: 20 mg via INTRAVENOUS
  Filled 2017-01-14: qty 1

## 2017-01-14 MED ORDER — SODIUM CHLORIDE 0.9% FLUSH: 3.0000 mL | INTRAVENOUS | Status: DC | PRN

## 2017-01-14 MED ORDER — ONDANSETRON HCL 4 MG/2ML IJ SOLN
4.0000 mg | Freq: Four times a day (QID) | INTRAMUSCULAR | Status: DC | PRN
  Administered 2017-01-15: 4 mg via INTRAVENOUS
  Filled 2017-01-14: qty 2

## 2017-01-14 MED ORDER — ORAL CARE MOUTH RINSE
15.0000 mL | OROMUCOSAL | Status: DC
  Administered 2017-01-14 – 2017-01-15 (×10): 15 mL via OROMUCOSAL

## 2017-01-14 MED ORDER — CHLORHEXIDINE GLUCONATE 0.12% ORAL RINSE (MEDLINE KIT)
15.0000 mL | Freq: Two times a day (BID) | OROMUCOSAL | Status: DC
  Administered 2017-01-14 – 2017-01-15 (×2): 15 mL via OROMUCOSAL

## 2017-01-14 MED ORDER — SODIUM CHLORIDE 0.9 % IV SOLN
Freq: Once | INTRAVENOUS | Status: AC
  Administered 2017-01-14: 17:00:00 via INTRAVENOUS

## 2017-01-14 MED ORDER — IOPAMIDOL (ISOVUE-370) INJECTION 76%
INTRAVENOUS | Status: AC
  Filled 2017-01-14: qty 100

## 2017-01-14 MED ORDER — HEPARIN (PORCINE) IN NACL 2-0.9 UNIT/ML-% IJ SOLN
INTRAMUSCULAR | Status: DC | PRN
  Administered 2017-01-14: 1000 mL

## 2017-01-14 MED ORDER — PANTOPRAZOLE SODIUM 40 MG IV SOLR
40.0000 mg | INTRAVENOUS | Status: DC
  Administered 2017-01-14 – 2017-01-15 (×2): 40 mg via INTRAVENOUS
  Filled 2017-01-14 (×2): qty 40

## 2017-01-14 MED ORDER — ACETAMINOPHEN 325 MG PO TABS: 650.0000 mg | ORAL_TABLET | ORAL | Status: DC | PRN

## 2017-01-14 MED ORDER — IOPAMIDOL (ISOVUE-370) INJECTION 76%
INTRAVENOUS | Status: DC | PRN
  Administered 2017-01-14: 30 mL via INTRAVENOUS

## 2017-01-14 MED ORDER — LIDOCAINE HCL (PF) 1 % IJ SOLN
INTRAMUSCULAR | Status: AC
  Filled 2017-01-14: qty 30

## 2017-01-14 MED ORDER — SODIUM CHLORIDE 0.9 % IV SOLN
INTRAVENOUS | Status: AC
  Administered 2017-01-14: 13:00:00 via INTRAVENOUS

## 2017-01-14 MED ORDER — SODIUM CHLORIDE 0.9% FLUSH
3.0000 mL | Freq: Two times a day (BID) | INTRAVENOUS | Status: DC
  Administered 2017-01-14 – 2017-01-17 (×7): 3 mL via INTRAVENOUS

## 2017-01-14 MED ORDER — ENOXAPARIN SODIUM 40 MG/0.4ML ~~LOC~~ SOLN: 40.0000 mg | SUBCUTANEOUS | Status: DC

## 2017-01-14 MED ORDER — SODIUM CHLORIDE 0.9 % IV SOLN: 250.0000 mL | INTRAVENOUS | Status: DC | PRN

## 2017-01-14 MED ORDER — LIDOCAINE HCL (PF) 1 % IJ SOLN
INTRAMUSCULAR | Status: DC | PRN
  Administered 2017-01-14: 20 mL

## 2017-01-14 SURGICAL SUPPLY — 7 items
CATH INFINITI 5FR MULTPACK ANG (CATHETERS) ×1 IMPLANT
DEVICE CLOSURE MYNXGRIP 5F (Vascular Products) ×1 IMPLANT
KIT HEART LEFT (KITS) ×2 IMPLANT
PACK CARDIAC CATHETERIZATION (CUSTOM PROCEDURE TRAY) ×2 IMPLANT
SHEATH PINNACLE 5F 10CM (SHEATH) ×1 IMPLANT
TRANSDUCER W/STOPCOCK (MISCELLANEOUS) ×2 IMPLANT
WIRE EMERALD 3MM-J .035X150CM (WIRE) ×1 IMPLANT
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
## 2017-01-14 NOTE — Progress Notes (Signed)
CRITICAL VALUE ALERT  Critical value received:  HGB 5.5  Date of notification:  01/14/2017  Time of notification:  1542  Critical value read back:Yes.    Nurse who received alert:  Glade LloydMelissa Snow, RN  MD notified (1st page):  Janee Mornhompson, MD  Time of first page:  1542  Responding MD:  Janee Mornhompson, MD  Time MD responded:  [phone]
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
## 2017-01-14 NOTE — Progress Notes (Signed)
RT traveled with patient to cath lab and then to CT without any apparent complications. Patient is now back in room 4N22. RT will monitor.

## 2017-01-14 NOTE — Interval H&P Note (Signed)
Cath Lab Visit (complete for each Cath Lab visit)  Clinical Evaluation Leading to the Procedure:   ACS: Yes.    Non-ACS:    Anginal Classification: CCS III  Anti-ischemic medical therapy: No Therapy  Non-Invasive Test Results: No non-invasive testing performed  Prior CABG: No previous CABG      History and Physical Interval Note:  01/14/2017 10:48 AM  Zachary Berg  has presented today for surgery, with the diagnosis of unstable angina  The various methods of treatment have been discussed with the patient and family. After consideration of risks, benefits and other options for treatment, the patient has consented to  Procedure(s): Left Heart Cath and Coronary Angiography (N/A) as a surgical intervention .  The patient's history has been reviewed, patient examined, no change in status, stable for surgery.  I have reviewed the patient's chart and labs.  Questions were answered to the patient's satisfaction.     Zachary Berg, Paymon Rosensteel

## 2017-01-14 NOTE — Progress Notes (Signed)
PT was put on 5/5  PT was not able to pull his TV  Volumes were 150 to 200

## 2017-01-14 NOTE — Progress Notes (Addendum)
Patient ID: Weston BrassDavid Gardenhire, male   DOB: September 25, 1971, 46 y.o.   MRN: [id number] Resp CX H flu prelim - D/C Vanco. CXR no large hemothorax FAST done with no free fluid seen in abdomen R groin no hematoma 2u PRBC stat. CBC after that, if Hb does not come up appropriately may need further imaging to R/O retroperitoneal hemorrhage. Heparin drip has been off.   I spoke with his wife at the bedside  Violeta GelinasBurke Kermit Arnette, MD, MPH, FACS Trauma: [phone] General Surgery: [phone]

## 2017-01-14 NOTE — Progress Notes (Signed)
ANTICOAGULATION CONSULT NOTE - Follow Up Consult  Pharmacy Consult for Heparin  Indication: chest pain/ACS  Allergies  Allergen Reactions  . Celebrex [Celecoxib] Hives    Patient Measurements: Height: 5\' 9"  (175.3 cm) Weight: 171 lb 8.3 oz (77.8 kg) IBW/kg (Calculated) : 70.7 Vital Signs: Temp: 100 F (37.8 C) (03/19 0400) Temp Source: Core (Comment) (03/19 0400) BP: 108/70 (03/19 0400) Pulse Rate: 74 (03/19 0400)  Labs:  Recent Labs  01/11/17 1814 01/11/17 1823 01/12/17 0559 01/12/17 1249 01/12/17 1830 01/12/17 2020 01/13/17 0311 01/13/17 1649 01/14/17 0335  HGB 16.5 16.7 13.5  --   --   --  10.8*  --   --   HCT 47.3 49.0 39.8  --   --   --  31.0*  --   --   PLT 266  --  172  --   --   --  98*  --   --   LABPROT 14.5  --   --   --   --   --   --   --   --   INR 1.13  --   --   --   --   --   --   --   --   HEPARINUNFRC  --   --   --   --   --   --   --  <0.10* 0.11*  CREATININE 1.12 1.50* 0.94  --   --   --  0.99  --   --   TROPONINI  --   --   --  >65.00* 33.89* 33.26*  --   --   --     Estimated Creatinine Clearance: 92.2 mL/min (by C-G formula based on SCr of 0.99 mg/dL).  Assessment: Heparin for elevated troponin, heparin level remains low, likely cath today  Goal of Therapy:  Heparin level 0.3-0.7 units/ml Monitor platelets by anticoagulation protocol: Yes   Plan:  -Inc heparin to 1500 units/hr -1300 HL  Abran DukeLedford, Fuquan Wilson 01/14/2017,4:59 AM
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
## 2017-01-14 NOTE — Progress Notes (Signed)
Patient ID: Zachary BrassDavid Berg, male   DOB: [birth year], 46 y.o.   MRN: [id number] Back S/P cath. Will wean to extubate.  Zachary GelinasBurke Jaella Weinert, MD, MPH, FACS Trauma: [phone] General Surgery: [phone]

## 2017-01-14 NOTE — Progress Notes (Signed)
RN gave patient sedation. Patient turned back to full support on ventilator.

## 2017-01-14 NOTE — Progress Notes (Signed)
Patient ID: Zachary BrassDavid Berg, male   DOB: 09/30/1971, 46 y.o.   MRN: [id number] Hb 5.5 Denies chest or abdominal pain, abd soft, NT Stat port CXR TF 2u PRBC stat.  Violeta GelinasBurke Evadene Wardrip, MD, MPH, FACS Trauma: [phone] General Surgery: [phone]
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
## 2017-01-15 ENCOUNTER — Inpatient Hospital Stay (HOSPITAL_COMMUNITY): Payer: BLUE CROSS/BLUE SHIELD

## 2017-01-15 DIAGNOSIS — Z720 Tobacco use: Secondary | ICD-10-CM

## 2017-01-15 DIAGNOSIS — I4901 Ventricular fibrillation: Secondary | ICD-10-CM

## 2017-01-15 DIAGNOSIS — I2109 ST elevation (STEMI) myocardial infarction involving other coronary artery of anterior wall: Secondary | ICD-10-CM

## 2017-01-15 DIAGNOSIS — E8770 Fluid overload, unspecified: Secondary | ICD-10-CM

## 2017-01-15 LAB — BASIC METABOLIC PANEL
ANION GAP: 7 (ref 5–15)
ANION GAP: 7 (ref 5–15)
ANION GAP: 8 (ref 5–15)
BUN: 9 mg/dL (ref 6–20)
BUN: 9 mg/dL (ref 6–20)
BUN: 7 mg/dL (ref 6–20)
CHLORIDE: 108 mmol/L (ref 101–111)
CHLORIDE: 109 mmol/L (ref 101–111)
CO2: 22 mmol/L (ref 22–32)
CO2: 23 mmol/L (ref 22–32)
CO2: 23 mmol/L (ref 22–32)
CREATININE: 0.86 mg/dL (ref 0.61–1.24)
Calcium: 7.4 mg/dL — ABNORMAL LOW (ref 8.9–10.3)
Calcium: 7.3 mg/dL — ABNORMAL LOW (ref 8.9–10.3)
Calcium: 7.8 mg/dL — ABNORMAL LOW (ref 8.9–10.3)
Chloride: 109 mmol/L (ref 101–111)
Creatinine, Ser: 0.87 mg/dL (ref 0.61–1.24)
Creatinine, Ser: 0.84 mg/dL (ref 0.61–1.24)
GFR calc Af Amer: >60 mL/min (ref >60)
GFR calc non Af Amer: >60 mL/min (ref >60)
GLUCOSE: 114 mg/dL — AB (ref 65–99)
Glucose, Bld: 119 mg/dL — ABNORMAL HIGH (ref 65–99)
Glucose, Bld: 121 mg/dL — ABNORMAL HIGH (ref 65–99)
POTASSIUM: 2.8 mmol/L — AB (ref 3.5–5.1)
POTASSIUM: 2.8 mmol/L — AB (ref 3.5–5.1)
Potassium: 3.3 mmol/L — ABNORMAL LOW (ref 3.5–5.1)
SODIUM: 137 mmol/L (ref 135–145)
SODIUM: 140 mmol/L (ref 135–145)
Sodium: 139 mmol/L (ref 135–145)

## 2017-01-15 LAB — BPAM RBC
BLOOD PRODUCT EXPIRATION DATE: 201803192359
BLOOD PRODUCT EXPIRATION DATE: 201803282359
Blood Product Expiration Date: 201803182359
Blood Product Expiration Date: 201804062359
ISSUE DATE / TIME: 201803181139
ISSUE DATE / TIME: 201803192050
ISSUE DATE / TIME: 201803181406
ISSUE DATE / TIME: 201803191644
UNIT TYPE AND RH: 6200
UNIT TYPE AND RH: 9500
Unit Type and Rh: 6200
Unit Type and Rh: 9500

## 2017-01-15 LAB — CBC
HCT: 26.5 % — ABNORMAL LOW (ref 39.0–52.0)
HEMATOCRIT: 29.3 % — AB (ref 39.0–52.0)
HEMOGLOBIN: 10 g/dL — AB (ref 13.0–17.0)
Hemoglobin: 9.1 g/dL — ABNORMAL LOW (ref 13.0–17.0)
MCH: 30.7 pg (ref 26.0–34.0)
MCH: 30.3 pg (ref 26.0–34.0)
MCHC: 34.3 g/dL (ref 30.0–36.0)
MCHC: 34.1 g/dL (ref 30.0–36.0)
MCV: 89.5 fL (ref 78.0–100.0)
MCV: 88.8 fL (ref 78.0–100.0)
PLATELETS: 113 10*3/uL — AB (ref 150–400)
Platelets: 138 10*3/uL — ABNORMAL LOW (ref 150–400)
RBC: 2.96 MIL/uL — ABNORMAL LOW (ref 4.22–5.81)
RBC: 3.3 MIL/uL — ABNORMAL LOW (ref 4.22–5.81)
RDW: 13.2 % (ref 11.5–15.5)
RDW: 13.2 % (ref 11.5–15.5)
WBC: 6.6 10*3/uL (ref 4.0–10.5)
WBC: 8.2 10*3/uL (ref 4.0–10.5)

## 2017-01-15 LAB — GLUCOSE, CAPILLARY
GLUCOSE-CAPILLARY: 130 mg/dL — AB (ref 65–99)
GLUCOSE-CAPILLARY: 123 mg/dL — AB (ref 65–99)
Glucose-Capillary: 123 mg/dL — ABNORMAL HIGH (ref 65–99)
Glucose-Capillary: 117 mg/dL — ABNORMAL HIGH (ref 65–99)

## 2017-01-15 LAB — TYPE AND SCREEN
ABO/RH(D): A POS
ABO/RH(D): A POS
ANTIBODY SCREEN: NEGATIVE
ANTIBODY SCREEN: NEGATIVE
UNIT DIVISION: 0
UNIT DIVISION: 0
UNIT DIVISION: 0
Unit division: 0

## 2017-01-15 LAB — MAGNESIUM: Magnesium: 1.9 mg/dL (ref 1.7–2.4)

## 2017-01-15 LAB — TRIGLYCERIDES: TRIGLYCERIDES: 150 mg/dL — AB (ref <150)

## 2017-01-15 MED ORDER — HYDROCODONE-ACETAMINOPHEN 5-325 MG PO TABS
1.0000 | ORAL_TABLET | Freq: Four times a day (QID) | ORAL | Status: DC | PRN
  Administered 2017-01-15 – 2017-01-17 (×6): 2 via ORAL
  Filled 2017-01-15 (×6): qty 2

## 2017-01-15 MED ORDER — SODIUM CHLORIDE 0.9 % IV SOLN
30.0000 meq | Freq: Once | INTRAVENOUS | Status: AC
  Administered 2017-01-15: 30 meq via INTRAVENOUS
  Filled 2017-01-15: qty 15

## 2017-01-15 MED ORDER — LORAZEPAM 2 MG/ML IJ SOLN: 1.0000 mg | Freq: Four times a day (QID) | INTRAMUSCULAR | Status: AC | PRN

## 2017-01-15 MED ORDER — POTASSIUM CHLORIDE 20 MEQ/15ML (10%) PO SOLN
20.0000 meq | Freq: Two times a day (BID) | ORAL | Status: DC
  Administered 2017-01-15: 20 meq
  Filled 2017-01-15: qty 15

## 2017-01-15 MED ORDER — HYDROCODONE-ACETAMINOPHEN 5-325 MG PO TABS
1.0000 | ORAL_TABLET | Freq: Once | ORAL | Status: AC
  Administered 2017-01-15: 1 via ORAL
  Filled 2017-01-15: qty 1

## 2017-01-15 MED ORDER — DEXTROSE 5 % IV SOLN
1.0000 g | INTRAVENOUS | Status: DC
  Administered 2017-01-15 – 2017-01-16 (×2): 1 g via INTRAVENOUS
  Filled 2017-01-15 (×3): qty 10

## 2017-01-15 MED ORDER — METOPROLOL TARTRATE 25 MG PO TABS
25.0000 mg | ORAL_TABLET | Freq: Two times a day (BID) | ORAL | Status: DC
  Administered 2017-01-15 – 2017-01-18 (×7): 25 mg via ORAL
  Filled 2017-01-15 (×7): qty 1

## 2017-01-15 MED ORDER — FOLIC ACID 1 MG PO TABS
1.0000 mg | ORAL_TABLET | Freq: Every day | ORAL | Status: DC
  Administered 2017-01-16 – 2017-01-18 (×3): 1 mg via ORAL
  Filled 2017-01-15 (×3): qty 1

## 2017-01-15 MED ORDER — ORAL CARE MOUTH RINSE
15.0000 mL | Freq: Two times a day (BID) | OROMUCOSAL | Status: DC
  Administered 2017-01-16 – 2017-01-17 (×3): 15 mL via OROMUCOSAL

## 2017-01-15 MED ORDER — FUROSEMIDE 10 MG/ML IJ SOLN
20.0000 mg | Freq: Once | INTRAMUSCULAR | Status: AC
  Administered 2017-01-15: 20 mg via INTRAVENOUS
  Filled 2017-01-15: qty 2

## 2017-01-15 MED ORDER — POTASSIUM CHLORIDE 20 MEQ/15ML (10%) PO SOLN
40.0000 meq | Freq: Every day | ORAL | Status: DC
  Administered 2017-01-15: 40 meq via ORAL
  Filled 2017-01-15: qty 30

## 2017-01-15 MED ORDER — THIAMINE HCL 100 MG/ML IJ SOLN
100.0000 mg | Freq: Every day | INTRAMUSCULAR | Status: DC
  Administered 2017-01-17 – 2017-01-18 (×2): 100 mg via INTRAVENOUS
  Filled 2017-01-15 (×3): qty 2

## 2017-01-15 MED ORDER — INSULIN ASPART 100 UNIT/ML ~~LOC~~ SOLN: 0.0000 [IU] | SUBCUTANEOUS | Status: DC

## 2017-01-15 MED ORDER — FENTANYL CITRATE (PF) 100 MCG/2ML IJ SOLN
50.0000 ug | INTRAMUSCULAR | Status: DC | PRN
  Administered 2017-01-15: 50 ug via INTRAVENOUS
  Filled 2017-01-15: qty 2

## 2017-01-15 MED ORDER — LORAZEPAM 1 MG PO TABS: 1.0000 mg | ORAL_TABLET | Freq: Four times a day (QID) | ORAL | Status: AC | PRN

## 2017-01-15 MED ORDER — FUROSEMIDE 10 MG/ML IJ SOLN
40.0000 mg | Freq: Every day | INTRAMUSCULAR | Status: AC
  Administered 2017-01-16 – 2017-01-17 (×2): 40 mg via INTRAVENOUS
  Filled 2017-01-15 (×2): qty 4

## 2017-01-15 MED ORDER — LORAZEPAM 2 MG/ML IJ SOLN
0.0000 mg | Freq: Four times a day (QID) | INTRAMUSCULAR | Status: AC
  Administered 2017-01-16 – 2017-01-17 (×2): 2 mg via INTRAVENOUS
  Filled 2017-01-15: qty 1

## 2017-01-15 MED ORDER — LORAZEPAM 2 MG/ML IJ SOLN
0.0000 mg | Freq: Two times a day (BID) | INTRAMUSCULAR | Status: DC
Start: 2017-01-17 — End: 2017-01-18
  Administered 2017-01-17: 2 mg via INTRAVENOUS
  Filled 2017-01-15: qty 2
  Filled 2017-01-15: qty 1

## 2017-01-15 MED ORDER — VITAMIN B-1 100 MG PO TABS
100.0000 mg | ORAL_TABLET | Freq: Every day | ORAL | Status: DC
  Administered 2017-01-16: 100 mg via ORAL
  Filled 2017-01-15: qty 1

## 2017-01-15 MED ORDER — POTASSIUM CHLORIDE CRYS ER 20 MEQ PO TBCR
60.0000 meq | EXTENDED_RELEASE_TABLET | Freq: Once | ORAL | Status: AC
  Administered 2017-01-15: 60 meq via ORAL
  Filled 2017-01-15: qty 3

## 2017-01-15 MED ORDER — ADULT MULTIVITAMIN W/MINERALS CH
1.0000 | ORAL_TABLET | Freq: Every day | ORAL | Status: DC
  Administered 2017-01-16 – 2017-01-18 (×3): 1 via ORAL
  Filled 2017-01-15 (×3): qty 1

## 2017-01-15 MED FILL — Medication: Qty: 1 | Status: AC

## 2017-01-15 NOTE — Progress Notes (Signed)
Orthopedic Tech Progress Note Patient Details:  Weston BrassDavid Poulton Dec 02, 1970 [id number]  Ortho Devices Type of Ortho Device: Ace wrap, Lenora BoysWatson Jones splint Ortho Device/Splint Interventions: Application   Saul FordyceJennifer C Lorette Peterkin 01/15/2017, 3:47 PM

## 2017-01-15 NOTE — Progress Notes (Signed)
Orthopedic Tech Progress Note Patient Details:  Weston BrassDavid Ibarra Nov 05, 1970 [id number]  Ortho Devices Type of Ortho Device: CAM walker Ortho Device/Splint Interventions: Application   Saul FordyceJennifer C Eloise Mula 01/15/2017, 12:36 PM
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
## 2017-01-15 NOTE — Procedures (Signed)
Extubation Procedure Note  Patient Details:   Name: Zachary Berg DOB: [birth year] MRN: [id number]   Airway Documentation:     Evaluation  O2 sats: 92  Complications: No apparent complications Patient did tolerate procedure well. Pt is awake and can follow commands. Pt has a positive cuff leak. Good gag/cough. Per MD order ok to extubate to keep sats > 92%. Deep oral sxn done. RT extubated pt to 8L HFNC. IS done 1000 X 10 with good effort. Pt able to speak. No stridor noted    Kandis NabHester, Coen Miyasato Lynn 01/15/2017, 11:55 AM
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
## 2017-01-15 NOTE — Progress Notes (Signed)
Foley removed.

## 2017-01-15 NOTE — Progress Notes (Signed)
Pt requesting pain meds early, pt does not want the Fentanyl ordered. Fredricka Bonineonnor, MD verbal order for one time dose vicodin 5-325 (1 tablet) once now. Verbal order to replace Potassium with 60 meq po once now also. Verbal order to stop D5NS fluids at this time. Will continue to monitor closely.   Vivianne MasterMelissa N Snow, RN

## 2017-01-15 NOTE — Progress Notes (Signed)
Patient refuses CBG checks and insulin coverage. Patient is eating, alert, oriented, and not a known diabetic.

## 2017-01-15 NOTE — Progress Notes (Signed)
Patient ID: [name], male   DOB: 1971-06-04, 46 y.o.   MRN: [id number] Doing well since extubation. C spine cleared. D/W Ortho and they rec NWB RLE and ordered knee brace and Cam walker Try clears in a bit  [name], MD, MPH, FACS Trauma: [phone] General Surgery: [phone]

## 2017-01-15 NOTE — Progress Notes (Signed)
Orthopedic Tech Progress Note Patient Details:  Zachary BrassDavid Berg 1971/02/11 [id number]  Patient ID: Zachary Brassavid Chamber, male   DOB: 1971/02/11, 46 y.o.   MRN: [id number]   [name] 01/15/2017, 11:26 AMCalled [employer] for Right Hinged knee brace.

## 2017-01-15 NOTE — Progress Notes (Signed)
Per MD order ok to extubate pt. RT extubated pt to 8L HFNC sats stable. Pt in no distress. Pt tol well

## 2017-01-15 NOTE — Progress Notes (Signed)
RT placed pt on SBT trial cpap/psv 5/5,50%. Decreased FIO2 to 50% due to stable sats

## 2017-01-15 NOTE — Progress Notes (Signed)
Patient K+ 2.8. Dwain SarnaWakefield, MD paged. Verbal orders to give 30mEq of Potassium Chloride IV once.
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
## 2017-01-16 ENCOUNTER — Encounter (HOSPITAL_COMMUNITY): Payer: Self-pay | Admitting: Physical Medicine and Rehabilitation

## 2017-01-16 ENCOUNTER — Inpatient Hospital Stay (HOSPITAL_COMMUNITY): Payer: BLUE CROSS/BLUE SHIELD

## 2017-01-16 DIAGNOSIS — G9340 Encephalopathy, unspecified: Secondary | ICD-10-CM

## 2017-01-16 DIAGNOSIS — S82131A Displaced fracture of medial condyle of right tibia, initial encounter for closed fracture: Secondary | ICD-10-CM

## 2017-01-16 LAB — CBC
HCT: 28.7 % — ABNORMAL LOW (ref 39.0–52.0)
Hemoglobin: 9.8 g/dL — ABNORMAL LOW (ref 13.0–17.0)
MCH: 30.4 pg (ref 26.0–34.0)
MCHC: 34.1 g/dL (ref 30.0–36.0)
MCV: 89.1 fL (ref 78.0–100.0)
PLATELETS: 161 10*3/uL (ref 150–400)
RBC: 3.22 MIL/uL — ABNORMAL LOW (ref 4.22–5.81)
RDW: 13.1 % (ref 11.5–15.5)
WBC: 8.4 10*3/uL (ref 4.0–10.5)

## 2017-01-16 LAB — LIPID PANEL
CHOLESTEROL: 109 mg/dL (ref 0–200)
HDL: 22 mg/dL — ABNORMAL LOW (ref >40)
LDL Cholesterol: 60 mg/dL (ref 0–99)
Total CHOL/HDL Ratio: 5 RATIO
Triglycerides: 135 mg/dL (ref <150)
VLDL: 27 mg/dL (ref 0–40)

## 2017-01-16 LAB — BASIC METABOLIC PANEL
Anion gap: 8 (ref 5–15)
BUN: 10 mg/dL (ref 6–20)
CALCIUM: 8 mg/dL — AB (ref 8.9–10.3)
CO2: 24 mmol/L (ref 22–32)
CREATININE: 0.76 mg/dL (ref 0.61–1.24)
Chloride: 108 mmol/L (ref 101–111)
GFR calc Af Amer: >60 mL/min (ref >60)
Glucose, Bld: 105 mg/dL — ABNORMAL HIGH (ref 65–99)
POTASSIUM: 3.3 mmol/L — AB (ref 3.5–5.1)
SODIUM: 140 mmol/L (ref 135–145)

## 2017-01-16 LAB — CULTURE, RESPIRATORY

## 2017-01-16 LAB — GLUCOSE, CAPILLARY
Glucose-Capillary: 110 mg/dL — ABNORMAL HIGH (ref 65–99)
Glucose-Capillary: 124 mg/dL — ABNORMAL HIGH (ref 65–99)

## 2017-01-16 LAB — CULTURE, RESPIRATORY W GRAM STAIN

## 2017-01-16 MED ORDER — FUROSEMIDE 10 MG/ML IJ SOLN
40.0000 mg | Freq: Once | INTRAMUSCULAR | Status: AC
  Administered 2017-01-16: 40 mg via INTRAVENOUS

## 2017-01-16 MED ORDER — HYDROMORPHONE HCL 1 MG/ML IJ SOLN
0.5000 mg | INTRAMUSCULAR | Status: DC | PRN
  Administered 2017-01-16: 2 mg via INTRAVENOUS
  Filled 2017-01-16: qty 2

## 2017-01-16 MED ORDER — PANTOPRAZOLE SODIUM 40 MG PO TBEC
40.0000 mg | DELAYED_RELEASE_TABLET | Freq: Every day | ORAL | Status: DC
  Administered 2017-01-16 – 2017-01-18 (×3): 40 mg via ORAL
  Filled 2017-01-16 (×3): qty 1

## 2017-01-16 MED ORDER — POTASSIUM CHLORIDE 20 MEQ/15ML (10%) PO SOLN
40.0000 meq | Freq: Two times a day (BID) | ORAL | Status: DC
  Administered 2017-01-16: 40 meq via ORAL
  Filled 2017-01-16: qty 30

## 2017-01-16 NOTE — Progress Notes (Signed)
Patient complains of worsening pain in left wrist with swelling. Patient also not a known diabetic and refuses more CBGs. Dr. Lindie SpruceWyatt paged. Verbal orders to get a portable left wrist xray and d/c CBG and insulin orders were given.
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
## 2017-01-16 NOTE — Plan of Care (Signed)
Problem: Pain Managment: Goal: General experience of comfort will improve Outcome: Progressing Re-education on pain management done
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
## 2017-01-16 NOTE — Progress Notes (Signed)
Inpatient Rehabilitation  Per PT request, patient was screened by Nevin Kozuch for appropriateness for an Inpatient Acute Rehab consult.  At this time we are recommending an Inpatient Rehab consult.  Please order if you are agreeable.    Meadow Abramo, M.A., CCC/SLP Admission Coordinator  [facility]  Cell [phone]  

## 2017-01-17 ENCOUNTER — Inpatient Hospital Stay (HOSPITAL_COMMUNITY): Payer: BLUE CROSS/BLUE SHIELD

## 2017-01-17 LAB — BASIC METABOLIC PANEL
ANION GAP: 9 (ref 5–15)
Anion gap: 11 (ref 5–15)
BUN: 14 mg/dL (ref 6–20)
BUN: 14 mg/dL (ref 6–20)
CALCIUM: 8.4 mg/dL — AB (ref 8.9–10.3)
CHLORIDE: 104 mmol/L (ref 101–111)
CO2: 23 mmol/L (ref 22–32)
CO2: 24 mmol/L (ref 22–32)
Calcium: 8.2 mg/dL — ABNORMAL LOW (ref 8.9–10.3)
Chloride: 104 mmol/L (ref 101–111)
Creatinine, Ser: 0.83 mg/dL (ref 0.61–1.24)
Creatinine, Ser: 0.77 mg/dL (ref 0.61–1.24)
GFR calc Af Amer: >60 mL/min (ref >60)
GFR calc Af Amer: >60 mL/min (ref >60)
GFR calc non Af Amer: >60 mL/min (ref >60)
GFR calc non Af Amer: >60 mL/min (ref >60)
Glucose, Bld: 106 mg/dL — ABNORMAL HIGH (ref 65–99)
Glucose, Bld: 123 mg/dL — ABNORMAL HIGH (ref 65–99)
Potassium: 3.3 mmol/L — ABNORMAL LOW (ref 3.5–5.1)
Potassium: 3.6 mmol/L (ref 3.5–5.1)
Sodium: 138 mmol/L (ref 135–145)
Sodium: 137 mmol/L (ref 135–145)

## 2017-01-17 MED ORDER — POTASSIUM CHLORIDE CRYS ER 20 MEQ PO TBCR
40.0000 meq | EXTENDED_RELEASE_TABLET | Freq: Two times a day (BID) | ORAL | Status: DC
  Administered 2017-01-17 – 2017-01-18 (×3): 40 meq via ORAL
  Filled 2017-01-17 (×3): qty 2

## 2017-01-17 MED ORDER — POTASSIUM CHLORIDE CRYS ER 20 MEQ PO TBCR
30.0000 meq | EXTENDED_RELEASE_TABLET | Freq: Two times a day (BID) | ORAL | Status: DC
  Filled 2017-01-17: qty 1

## 2017-01-17 MED ORDER — SODIUM CHLORIDE 0.9 % IV SOLN
30.0000 meq | Freq: Once | INTRAVENOUS | Status: AC
  Administered 2017-01-17: 30 meq via INTRAVENOUS
  Filled 2017-01-17: qty 15

## 2017-01-17 MED ORDER — OXYCODONE HCL 5 MG PO TABS
5.0000 mg | ORAL_TABLET | ORAL | Status: DC | PRN
  Administered 2017-01-17 – 2017-01-18 (×4): 10 mg via ORAL
  Filled 2017-01-17 (×4): qty 2

## 2017-01-17 MED ORDER — ACETAMINOPHEN 325 MG PO TABS
650.0000 mg | ORAL_TABLET | Freq: Four times a day (QID) | ORAL | Status: DC
  Administered 2017-01-17 – 2017-01-18 (×4): 650 mg via ORAL
  Filled 2017-01-17 (×5): qty 2

## 2017-01-17 MED ORDER — AMPICILLIN 500 MG PO CAPS
500.0000 mg | ORAL_CAPSULE | Freq: Four times a day (QID) | ORAL | Status: DC
Start: 2017-01-17 — End: 2017-01-18
  Administered 2017-01-17 – 2017-01-18 (×6): 500 mg via ORAL
  Filled 2017-01-17 (×8): qty 1

## 2017-01-17 MED ORDER — ENOXAPARIN SODIUM 40 MG/0.4ML ~~LOC~~ SOLN
40.0000 mg | SUBCUTANEOUS | Status: DC
  Administered 2017-01-17 – 2017-01-18 (×2): 40 mg via SUBCUTANEOUS
  Filled 2017-01-17 (×2): qty 0.4
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
## 2017-01-17 NOTE — Progress Notes (Signed)
Rehab admissions - I am following for acute inpatient rehab admission.  I will open the case with [employer] and request inpatient rehab.  I will follow up with patient later this morning.   Call me for questions.  #[phone]
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
## 2017-01-17 NOTE — Progress Notes (Signed)
Rehab admissions - I spoke with patient's wife.  She plans for supervision to be provided by mom, dad and patient's sister who are retired and can stay with patient while wife is at work.  I do have approval for acute inpatient rehab admission.  Pending bed availability, I will try to admit to inpatient rehab tomorrow.  Call me for questions.  #[phone]

## 2017-01-18 ENCOUNTER — Encounter (HOSPITAL_COMMUNITY): Payer: Self-pay | Admitting: *Deleted

## 2017-01-18 ENCOUNTER — Inpatient Hospital Stay (HOSPITAL_COMMUNITY)
Admission: RE | Admit: 2017-01-18 | Discharge: 2017-01-23 | DRG: 559 | Disposition: A | Discharge status: Home Health | Payer: BLUE CROSS/BLUE SHIELD | Source: Intra-hospital | Attending: Physical Medicine & Rehabilitation | Admitting: Physical Medicine & Rehabilitation

## 2017-01-18 ENCOUNTER — Encounter: Payer: Self-pay | Admitting: Family

## 2017-01-18 DIAGNOSIS — S52252D Displaced comminuted fracture of shaft of ulna, left arm, subsequent encounter for closed fracture with routine healing: Secondary | ICD-10-CM | POA: Diagnosis not present

## 2017-01-18 DIAGNOSIS — S2242XD Multiple fractures of ribs, left side, subsequent encounter for fracture with routine healing: Secondary | ICD-10-CM | POA: Diagnosis not present

## 2017-01-18 DIAGNOSIS — S52255D Nondisplaced comminuted fracture of shaft of ulna, left arm, subsequent encounter for closed fracture with routine healing: Secondary | ICD-10-CM | POA: Diagnosis not present

## 2017-01-18 DIAGNOSIS — S52202A Unspecified fracture of shaft of left ulna, initial encounter for closed fracture: Secondary | ICD-10-CM | POA: Diagnosis not present

## 2017-01-18 DIAGNOSIS — Z888 Allergy status to other drugs, medicaments and biological substances status: Secondary | ICD-10-CM

## 2017-01-18 DIAGNOSIS — F1010 Alcohol abuse, uncomplicated: Secondary | ICD-10-CM | POA: Diagnosis present

## 2017-01-18 DIAGNOSIS — Z79899 Other long term (current) drug therapy: Secondary | ICD-10-CM | POA: Diagnosis not present

## 2017-01-18 DIAGNOSIS — Z8711 Personal history of peptic ulcer disease: Secondary | ICD-10-CM

## 2017-01-18 DIAGNOSIS — I2109 ST elevation (STEMI) myocardial infarction involving other coronary artery of anterior wall: Secondary | ICD-10-CM | POA: Diagnosis present

## 2017-01-18 DIAGNOSIS — K219 Gastro-esophageal reflux disease without esophagitis: Secondary | ICD-10-CM | POA: Diagnosis present

## 2017-01-18 DIAGNOSIS — S069X0D Unspecified intracranial injury without loss of consciousness, subsequent encounter: Secondary | ICD-10-CM | POA: Diagnosis not present

## 2017-01-18 DIAGNOSIS — E785 Hyperlipidemia, unspecified: Secondary | ICD-10-CM | POA: Diagnosis present

## 2017-01-18 DIAGNOSIS — D62 Acute posthemorrhagic anemia: Secondary | ICD-10-CM | POA: Diagnosis present

## 2017-01-18 DIAGNOSIS — I10 Essential (primary) hypertension: Secondary | ICD-10-CM | POA: Diagnosis present

## 2017-01-18 DIAGNOSIS — F1210 Cannabis abuse, uncomplicated: Secondary | ICD-10-CM | POA: Diagnosis present

## 2017-01-18 DIAGNOSIS — M7989 Other specified soft tissue disorders: Secondary | ICD-10-CM | POA: Diagnosis not present

## 2017-01-18 DIAGNOSIS — S062X2S Diffuse traumatic brain injury with loss of consciousness of 31 minutes to 59 minutes, sequela: Secondary | ICD-10-CM | POA: Diagnosis not present

## 2017-01-18 DIAGNOSIS — S8251XB Displaced fracture of medial malleolus of right tibia, initial encounter for open fracture type I or II: Secondary | ICD-10-CM | POA: Diagnosis present

## 2017-01-18 DIAGNOSIS — S52255S Nondisplaced comminuted fracture of shaft of ulna, left arm, sequela: Secondary | ICD-10-CM | POA: Diagnosis not present

## 2017-01-18 DIAGNOSIS — S8251XD Displaced fracture of medial malleolus of right tibia, subsequent encounter for closed fracture with routine healing: Secondary | ICD-10-CM | POA: Diagnosis not present

## 2017-01-18 DIAGNOSIS — S8254XS Nondisplaced fracture of medial malleolus of right tibia, sequela: Secondary | ICD-10-CM | POA: Diagnosis not present

## 2017-01-18 DIAGNOSIS — R52 Pain, unspecified: Secondary | ICD-10-CM

## 2017-01-18 DIAGNOSIS — S82141D Displaced bicondylar fracture of right tibia, subsequent encounter for closed fracture with routine healing: Principal | ICD-10-CM

## 2017-01-18 DIAGNOSIS — S069X2S Unspecified intracranial injury with loss of consciousness of 31 minutes to 59 minutes, sequela: Secondary | ICD-10-CM | POA: Diagnosis not present

## 2017-01-18 DIAGNOSIS — K5900 Constipation, unspecified: Secondary | ICD-10-CM | POA: Diagnosis present

## 2017-01-18 LAB — CBC
HCT: 34 % — ABNORMAL LOW (ref 39.0–52.0)
HEMATOCRIT: 35.4 % — AB (ref 39.0–52.0)
HEMOGLOBIN: 12.3 g/dL — AB (ref 13.0–17.0)
Hemoglobin: 11.6 g/dL — ABNORMAL LOW (ref 13.0–17.0)
MCH: 30.8 pg (ref 26.0–34.0)
MCH: 31.1 pg (ref 26.0–34.0)
MCHC: 34.1 g/dL (ref 30.0–36.0)
MCHC: 34.7 g/dL (ref 30.0–36.0)
MCV: 90.2 fL (ref 78.0–100.0)
MCV: 89.4 fL (ref 78.0–100.0)
PLATELETS: 278 10*3/uL (ref 150–400)
PLATELETS: 324 10*3/uL (ref 150–400)
RBC: 3.77 MIL/uL — ABNORMAL LOW (ref 4.22–5.81)
RBC: 3.96 MIL/uL — AB (ref 4.22–5.81)
RDW: 13 % (ref 11.5–15.5)
RDW: 12.8 % (ref 11.5–15.5)
WBC: 10.1 10*3/uL (ref 4.0–10.5)
WBC: 10.5 10*3/uL (ref 4.0–10.5)

## 2017-01-18 LAB — CULTURE, BLOOD (ROUTINE X 2)
CULTURE: NO GROWTH
CULTURE: NO GROWTH

## 2017-01-18 LAB — CREATININE, SERUM
CREATININE: 0.79 mg/dL (ref 0.61–1.24)
GFR calc Af Amer: >60 mL/min (ref >60)

## 2017-01-18 LAB — BASIC METABOLIC PANEL
ANION GAP: 9 (ref 5–15)
BUN: 15 mg/dL (ref 6–20)
CALCIUM: 8.4 mg/dL — AB (ref 8.9–10.3)
CO2: 22 mmol/L (ref 22–32)
Chloride: 105 mmol/L (ref 101–111)
Creatinine, Ser: 0.8 mg/dL (ref 0.61–1.24)
GFR calc Af Amer: >60 mL/min (ref >60)
GLUCOSE: 103 mg/dL — AB (ref 65–99)
Potassium: 4.1 mmol/L (ref 3.5–5.1)
Sodium: 136 mmol/L (ref 135–145)

## 2017-01-18 MED ORDER — PANTOPRAZOLE SODIUM 40 MG PO TBEC
40.0000 mg | DELAYED_RELEASE_TABLET | Freq: Every day | ORAL | Status: DC
  Administered 2017-01-19 – 2017-01-23 (×5): 40 mg via ORAL
  Filled 2017-01-18 (×5): qty 1

## 2017-01-18 MED ORDER — ADULT MULTIVITAMIN W/MINERALS CH
1.0000 | ORAL_TABLET | Freq: Every day | ORAL | Status: DC
  Administered 2017-01-19 – 2017-01-23 (×5): 1 via ORAL
  Filled 2017-01-18 (×5): qty 1

## 2017-01-18 MED ORDER — ACETAMINOPHEN 325 MG PO TABS
650.0000 mg | ORAL_TABLET | Freq: Four times a day (QID) | ORAL | Status: DC
  Administered 2017-01-18 – 2017-01-23 (×17): 650 mg via ORAL
  Filled 2017-01-18 (×17): qty 2

## 2017-01-18 MED ORDER — METOPROLOL TARTRATE 25 MG PO TABS
25.0000 mg | ORAL_TABLET | Freq: Two times a day (BID) | ORAL | Status: DC
  Administered 2017-01-18 – 2017-01-23 (×10): 25 mg via ORAL
  Filled 2017-01-18 (×10): qty 1

## 2017-01-18 MED ORDER — SORBITOL 70 % SOLN: 30.0000 mL | Freq: Every day | Status: DC | PRN

## 2017-01-18 MED ORDER — ASPIRIN 81 MG PO CHEW
81.0000 mg | CHEWABLE_TABLET | Freq: Every day | ORAL | Status: DC
  Administered 2017-01-19 – 2017-01-23 (×5): 81 mg via ORAL
  Filled 2017-01-18 (×5): qty 1

## 2017-01-18 MED ORDER — ATORVASTATIN CALCIUM 80 MG PO TABS
80.0000 mg | ORAL_TABLET | Freq: Every day | ORAL | Status: DC
  Administered 2017-01-18 – 2017-01-22 (×5): 80 mg via ORAL
  Filled 2017-01-18 (×5): qty 1

## 2017-01-18 MED ORDER — FOLIC ACID 1 MG PO TABS
1.0000 mg | ORAL_TABLET | Freq: Every day | ORAL | Status: DC
  Administered 2017-01-19 – 2017-01-23 (×5): 1 mg via ORAL
  Filled 2017-01-18 (×5): qty 1

## 2017-01-18 MED ORDER — ONDANSETRON HCL 4 MG/2ML IJ SOLN: 4.0000 mg | Freq: Four times a day (QID) | INTRAMUSCULAR | Status: DC | PRN

## 2017-01-18 MED ORDER — OXYCODONE HCL 5 MG PO TABS
5.0000 mg | ORAL_TABLET | ORAL | Status: DC | PRN
  Administered 2017-01-18 – 2017-01-21 (×10): 10 mg via ORAL
  Administered 2017-01-21: 5 mg via ORAL
  Administered 2017-01-21 – 2017-01-23 (×10): 10 mg via ORAL
  Filled 2017-01-18 (×21): qty 2

## 2017-01-18 MED ORDER — ONDANSETRON HCL 4 MG PO TABS: 4.0000 mg | ORAL_TABLET | Freq: Four times a day (QID) | ORAL | Status: DC | PRN

## 2017-01-18 MED ORDER — ENOXAPARIN SODIUM 40 MG/0.4ML ~~LOC~~ SOLN: 40.0000 mg | SUBCUTANEOUS | Status: DC

## 2017-01-18 MED ORDER — AMPICILLIN 500 MG PO CAPS
500.0000 mg | ORAL_CAPSULE | Freq: Four times a day (QID) | ORAL | Status: AC
  Administered 2017-01-18 – 2017-01-19 (×3): 500 mg via ORAL
  Filled 2017-01-18 (×3): qty 1

## 2017-01-18 MED ORDER — ENOXAPARIN SODIUM 40 MG/0.4ML ~~LOC~~ SOLN
40.0000 mg | SUBCUTANEOUS | Status: DC
  Administered 2017-01-19 – 2017-01-22 (×4): 40 mg via SUBCUTANEOUS
  Filled 2017-01-18 (×5): qty 0.4
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
## 2017-01-18 NOTE — Progress Notes (Signed)
Patient ID: Zachary GroveDavid D Berg, male   DOB: 03-Mar-1971, 46 y.o.   MRN: [id number] Patient arrived with RN, family, and belongings from [id number]. Patient and family oriented to rehab process, schedule, fall prevention plan, safety plan, health resource notebook, and nurse call system. Pain medication given for 8/10 pain in sternum and all questions answered. Dietary tray ordered from kitchen and to be delivered soon. Patient resting with family at bedside and call bell at side.
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
## 2017-01-18 NOTE — Progress Notes (Signed)
Report called to [facility] on 4W
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
## 2017-01-19 ENCOUNTER — Inpatient Hospital Stay (HOSPITAL_COMMUNITY): Payer: BLUE CROSS/BLUE SHIELD | Admitting: Occupational Therapy

## 2017-01-19 ENCOUNTER — Inpatient Hospital Stay (HOSPITAL_COMMUNITY): Payer: BLUE CROSS/BLUE SHIELD | Admitting: Physical Therapy

## 2017-01-19 ENCOUNTER — Inpatient Hospital Stay (HOSPITAL_COMMUNITY): Payer: BLUE CROSS/BLUE SHIELD | Admitting: Speech Pathology

## 2017-01-19 ENCOUNTER — Inpatient Hospital Stay (HOSPITAL_COMMUNITY): Payer: BLUE CROSS/BLUE SHIELD

## 2017-01-19 DIAGNOSIS — S069X2S Unspecified intracranial injury with loss of consciousness of 31 minutes to 59 minutes, sequela: Secondary | ICD-10-CM

## 2017-01-19 DIAGNOSIS — S062X2S Diffuse traumatic brain injury with loss of consciousness of 31 minutes to 59 minutes, sequela: Secondary | ICD-10-CM
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
## 2017-01-20 ENCOUNTER — Inpatient Hospital Stay (HOSPITAL_COMMUNITY): Payer: BLUE CROSS/BLUE SHIELD | Admitting: Occupational Therapy

## 2017-01-20 ENCOUNTER — Encounter (HOSPITAL_COMMUNITY): Payer: BLUE CROSS/BLUE SHIELD

## 2017-01-20 DIAGNOSIS — S52202A Unspecified fracture of shaft of left ulna, initial encounter for closed fracture: Secondary | ICD-10-CM

## 2017-01-20 DIAGNOSIS — S062X2S Diffuse traumatic brain injury with loss of consciousness of 31 minutes to 59 minutes, sequela: Secondary | ICD-10-CM
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
## 2017-01-20 NOTE — Progress Notes (Signed)
Orthopaedic Trauma Service (OTS)   Subjective: Patient reports pain as improving on both his right knee and his left forearm.  He has been using a walker without bracing for the forearm. Xrays were obtained today for c/o pain with this and showed midshaft ulna fracture.   Objective: Temp:  [98.6 F (37 C)-99.3 F (37.4 C)] 98.6 F (37 C) (03/25 0543) Pulse Rate:  [62-76] 70 (03/25 0543) Resp:  [18] 18 (03/25 0543) BP: (104-117)/(64-79) 117/79 (03/25 0543) SpO2:  [93 %-96 %] 96 % (03/25 0543) Weight:  [73.8 kg (162 lb 11.2 oz)] 73.8 kg (162 lb 11.2 oz) (03/25 0543) Physical Exam R show ROM 5-80 degrees; much less swelling LUEX  Midshaft swelling  Sens  Ax/R/M/U intact  Mot   Ax/ R/ PIN/ M/ AIN/ U intact  Brisk CR, Rad 2+  Xrays show comminuted ulnar shaft with minimal angulation, less than 2mm shortening, and <50% translation  Assessment/Plan: Discussed operative and nonoperative risks and benefits, which included the fact that patient's current parameters of this nightstick ulna fracture qualify it for nonoperative treatment, but that surgical repair had little morbidity and would result in early motion without the need for bracing. At this time he wishes to proceed with bracing. We will see how he progresses and can convert to ORIF should this be insufficient.  Fracture brace ordered, anticipate tomorrow.  May WB to tolerance in the brace but no exercises of the wrist against resistance otherwise.  Will follow.  Continue PT for the knee.  Myrene GalasMichael Sunday Klos, MD [facility] [phone] (p)

## 2017-01-20 NOTE — Progress Notes (Signed)
Orthopedic Tech Progress Note Patient Details:  Zachary GroveDavid D Berg 1971-04-15 [id number]  Ortho Devices Type of Ortho Device: Arm sling, Ace wrap, Sugartong splint Ortho Device/Splint Interventions: Application   Saul FordyceJennifer C Jurni Cesaro 01/20/2017, 1:04 PM

## 2017-01-20 NOTE — Progress Notes (Addendum)
Occupational Therapy Session Note  Patient Details  Name: Zachary Berg MRN: [id number] Date of Birth: 26-Jun-1971  Today's Date: 01/20/2017 OT Individual Time: 1346-1416 OT Individual Time Calculation (min): 30 min    Short Term Goals: Week 1:  OT Short Term Goal 1 (Week 1): Pt will complete LB dressing with supervision and use of AE OT Short Term Goal 2 (Week 1): Pt will complete functional shower transfer with supervision while adhering to precautions OT Short Term Goal 3 (Week 1): Pt will ambulate into bathroom with Min A and LRAD for toilet transfer  Skilled Therapeutic Interventions/Progress Updates: Pt was lying in bed with only boxer shorts and family present at time of arrival. He had a new cast for L UE. Went over ortho precautions with pt and family, including WBAT for L UE, and being able to doff knee brace in bed and when resting. After knee brace was donned by therapist and spouse, we practiced using reacher for LB dressing, which he completed with supervision utilizing lateral leans at EOB. He was also able to doff/don left arm sling with min vcs. Afterwards he transferred to recliner with Min A stand pivot with R LE elevated off of floor, cues to slow down due to impulsivity. He was left with family and all needs at time of departure.    Therapy Documentation Precautions:  Precautions Precautions: Fall Required Braces or Orthoses: Other Brace/Splint Other Brace/Splint: hinged knee brace R Restrictions Weight Bearing Restrictions: Yes RLE Weight Bearing: Non weight bearing General:   Vital Signs: Therapy Vitals Temp: 98.6 F (37 C) Temp Source: Oral Pulse Rate: 76 Resp: 18 BP: 118/78 Patient Position (if appropriate): Lying Oxygen Therapy SpO2: 98 % O2 Device: Not Delivered Pain: Pain Assessment Pain Score: 4  ADL: ADL ADL Comments: Please see functional navigator for ADL status    See Function Navigator for Current Functional Status.   Therapy/Group:  Individual Therapy  Zachary Berg 01/20/2017, 4:02 PM

## 2017-01-21 ENCOUNTER — Inpatient Hospital Stay (HOSPITAL_COMMUNITY): Payer: BLUE CROSS/BLUE SHIELD | Admitting: Occupational Therapy

## 2017-01-21 ENCOUNTER — Inpatient Hospital Stay (HOSPITAL_COMMUNITY): Payer: BLUE CROSS/BLUE SHIELD

## 2017-01-21 ENCOUNTER — Inpatient Hospital Stay (HOSPITAL_COMMUNITY): Payer: BLUE CROSS/BLUE SHIELD | Admitting: Physical Therapy

## 2017-01-21 DIAGNOSIS — M7989 Other specified soft tissue disorders: Secondary | ICD-10-CM

## 2017-01-21 DIAGNOSIS — S82141D Displaced bicondylar fracture of right tibia, subsequent encounter for closed fracture with routine healing: Principal | ICD-10-CM

## 2017-01-21 LAB — CBC WITH DIFFERENTIAL/PLATELET
BASOS ABS: 0 10*3/uL (ref 0.0–0.1)
Basophils Relative: 0 %
Eosinophils Absolute: 0.3 10*3/uL (ref 0.0–0.7)
Eosinophils Relative: 3 %
HEMATOCRIT: 37.7 % — AB (ref 39.0–52.0)
HEMOGLOBIN: 12.9 g/dL — AB (ref 13.0–17.0)
LYMPHS ABS: 1.9 10*3/uL (ref 0.7–4.0)
LYMPHS PCT: 18 %
MCH: 31.1 pg (ref 26.0–34.0)
MCHC: 34.2 g/dL (ref 30.0–36.0)
MCV: 90.8 fL (ref 78.0–100.0)
Monocytes Absolute: 1.5 10*3/uL — ABNORMAL HIGH (ref 0.1–1.0)
Monocytes Relative: 15 %
NEUTROS ABS: 6.7 10*3/uL (ref 1.7–7.7)
NEUTROS PCT: 64 %
PLATELETS: 434 10*3/uL — AB (ref 150–400)
RBC: 4.15 MIL/uL — AB (ref 4.22–5.81)
RDW: 13.1 % (ref 11.5–15.5)
WBC: 10.4 10*3/uL (ref 4.0–10.5)

## 2017-01-21 LAB — COMPREHENSIVE METABOLIC PANEL
ALT: 82 U/L — AB (ref 17–63)
AST: 53 U/L — ABNORMAL HIGH (ref 15–41)
Albumin: 2.8 g/dL — ABNORMAL LOW (ref 3.5–5.0)
Alkaline Phosphatase: 141 U/L — ABNORMAL HIGH (ref 38–126)
Anion gap: 10 (ref 5–15)
BUN: 17 mg/dL (ref 6–20)
CALCIUM: 9 mg/dL (ref 8.9–10.3)
CHLORIDE: 102 mmol/L (ref 101–111)
CO2: 23 mmol/L (ref 22–32)
CREATININE: 0.85 mg/dL (ref 0.61–1.24)
Glucose, Bld: 108 mg/dL — ABNORMAL HIGH (ref 65–99)
Potassium: 4.3 mmol/L (ref 3.5–5.1)
Sodium: 135 mmol/L (ref 135–145)
Total Bilirubin: 1.1 mg/dL (ref 0.3–1.2)
Total Protein: 6.2 g/dL — ABNORMAL LOW (ref 6.5–8.1)

## 2017-01-21 MED ORDER — LIDOCAINE 5 % EX PTCH
1.0000 | MEDICATED_PATCH | CUTANEOUS | Status: DC
  Administered 2017-01-21 – 2017-01-22 (×2): 1 via TRANSDERMAL
  Filled 2017-01-21 (×2): qty 1

## 2017-01-21 NOTE — Progress Notes (Signed)
Patient information reviewed and entered into eRehab system by Criag Wicklund, RN, CRRN, PPS Coordinator.  Information including medical coding and functional independence measure will be reviewed and updated through discharge.    
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
## 2017-01-21 NOTE — Progress Notes (Signed)
Physical Therapy Session Note  Patient Details  Name: Zachary Berg MRN: [id number] Date of Birth: [birth year]  Today's Date: 01/21/2017 PT Individual Time: 1430-1530 PT Individual Time Calculation (min): 60 min   Short Term Goals: Week 1:  PT Short Term Goal 1 (Week 1): pt will perform squat pivot transfer with supervision assist  PT Short Term Goal 2 (Week 1): Pt will propell WC >26600ft with supervisoin assist in crowded environment PT Short Term Goal 3 (Week 1): sit<>stand with supervisoin assist.  PT Short Term Goal 4 (Week 1): Pt will ambulate 7140ft with min assist  PT Short Term Goal 5 (Week 1): pt will perform bed mobility with supervision assist.   Skilled Therapeutic Interventions/Progress Updates:    Session focused on addressing d/c planning, home environment mobility with PFRW and with w/c in ADL apartment to simulate small space negotiation including tall bed transfers, mobility in the kitchen, and practicing furniture transfers (both low and soft sofas and rocking recliner), education on energy conservation techniques in the home, w/c propulsion for endurance and w/c parts management (cues initially to recall where buttons are located but then completed independently), and various transfers throughout session overall supervision. Pt with 1 LOB during gait on carpet during turn requiring min assist to recover. Notified CSW of DME needs (w/c with ELR and L PFRW). Pt measured for LUE brace by [employer] during session. Pt making excellent progress towards LTGs.   Therapy Documentation Precautions:  Precautions Precautions: Fall Required Braces or Orthoses: Other Brace/Splint Other Brace/Splint: hinged knee brace R Restrictions Weight Bearing Restrictions: Yes LUE Weight Bearing: Weight bearing as tolerated (with brace) RLE Weight Bearing: Non weight bearing Other Position/Activity Restrictions: no ROM restrictions on RLE; brace may be off in bed/resting Pain: Premedicated for rib  pain, LUE, and RLE pain.   See Function Navigator for Current Functional Status.   Therapy/Group: Individual Therapy  Zachary Berg, Zachary Berg  Allsion [place] B. Charday Capetillo, PT, DPT  01/21/2017, 3:39 PM

## 2017-01-21 NOTE — Care Management Note (Signed)
[facility] Individual Statement of Services  Patient Name:  Zachary Berg  Date:  01/21/2017  Welcome to the [facility].  Our goal is to provide you with an individualized program based on your diagnosis and situation, designed to meet your specific needs.  With this comprehensive rehabilitation program, you will be expected to participate in at least 3 hours of rehabilitation therapies Monday-Friday, with modified therapy programming on the weekends.  Your rehabilitation program will include the following services:  Physical Therapy (PT), Occupational Therapy (OT), Speech Therapy (ST), 24 hour per day rehabilitation nursing, Neuropsychology, Case Management (Social Worker), Rehabilitation Medicine, Nutrition Services and Pharmacy Services  Weekly team conferences will be held on Wednesday to discuss your progress.  Your Social Worker will talk with you frequently to get your input and to update you on team discussions.  Team conferences with you and your family in attendance may also be held.  Expected length of stay: 10-12 days Overall anticipated outcome: supervision with cueing Depending on your progress and recovery, your program may change. Your Social Worker will coordinate services and will keep you informed of any changes. Your Social Worker's name and contact numbers are listed  below.  The following services may also be recommended but are not provided by the [facility]:   Driving Evaluations  Home Health Rehabiltiation Services  Outpatient Rehabilitation Services  Vocational Rehabilitation   Arrangements will be made to provide these services after discharge if needed.  Arrangements include referral to agencies that provide these services.  Your insurance has been verified to be:  [employer] Your primary doctor is:  Laurette SchimkeMelissa Osulliaven  Pertinent information will be shared with your doctor and your insurance  company.  Social Worker:  Dossie DerBecky Kortney Schoenfelder, SW [phone] or ([phone]) [phone]  Information discussed with and copy given to patient by: Lucy Chrisupree, Cylus Douville G, 01/21/2017, 10:42 AM

## 2017-01-21 NOTE — Progress Notes (Signed)
Occupational Therapy Session Note  Patient Details  Name: Zachary Berg MRN: [id number] Date of Birth: [birth year]  Today's Date: 01/21/2017 OT Individual Time: 5488-4573 OT Individual Time Calculation (min): 75 min    Short Term Goals: Week 1:  OT Short Term Goal 1 (Week 1): Pt will complete LB dressing with supervision and use of AE OT Short Term Goal 2 (Week 1): Pt will complete functional shower transfer with supervision while adhering to precautions OT Short Term Goal 3 (Week 1): Pt will ambulate into bathroom with Min A and LRAD for toilet transfer  Skilled Therapeutic Interventions/Progress Updates:    Pt seen this session to facilitate functional mobility with self care skills. Pt alert and oriented and demonstrated good memory and problem solving skills. He sat to EOB to don clothing with S. A to don R leg brace. Sit to stand to RW with close S and then close S stand pivot to w/c with RW.  Pt complete grooming from w/c level independently.  Used w/c to enter bathroom with A due to elevated threshold. Stand pivot with RW to [facility] over toilet with S and completed toileting with S. Pt resting in w/c with all needs met.  Throughout the session while pt was grooming and then later toileting (pt stated he needed to sit for awhile), this therapist contacted ortho PA to clarify splint orders and then contacted clinician from [employer] to fabricate LUE clam shell splint for pt.     Therapy Documentation Precautions:  Precautions Precautions: Fall Required Braces or Orthoses: Other Brace/Splint Other Brace/Splint: hinged knee brace R Restrictions Weight Bearing Restrictions: Yes RLE Weight Bearing: Non weight bearing   Pain: Pain Assessment Pain Assessment: 0-10 Pain Score: 8  Pain Type: Acute pain Pain Location: Rib cage Pain Orientation: Left Pain Descriptors / Indicators: Aching;Sore Pain Frequency: Constant Pain Onset: On-going Patients Stated Pain Goal: 2 Pain Intervention(s):  Medication (See eMAR);Repositioned Multiple Pain Sites: No ADL: ADL ADL Comments: Please see functional navigator for ADL status  See Function Navigator for Current Functional Status.   Therapy/Group: Individual Therapy  [place] 01/21/2017, 12:11 PM
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
## 2017-01-21 NOTE — Progress Notes (Signed)
*  Preliminary Results* Bilateral lower extremity venous duplex completed. Bilateral lower extremities are negative for deep vein thrombosis. There is no evidence of Baker's cyst bilaterally.  01/21/2017 5:07 PM Gertie FeyMichelle Miia Blanks, BS, RVT, RDCS, RDMS
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
## 2017-01-21 NOTE — Progress Notes (Signed)
Physical Therapy Session Note  Patient Details  Name: Zachary Berg MRN: [id number] Date of Birth: 05-05-1971  Today's Date: 01/21/2017 PT Individual Time: 1001-1101 PT Individual Time Calculation (min): 60 min   Short Term Goals: Week 1:  PT Short Term Goal 1 (Week 1): pt will perform squat pivot transfer with supervision assist  PT Short Term Goal 2 (Week 1): Pt will propell WC >27900ft with supervisoin assist in crowded environment PT Short Term Goal 3 (Week 1): sit<>stand with supervisoin assist.  PT Short Term Goal 4 (Week 1): Pt will ambulate 6840ft with min assist  PT Short Term Goal 5 (Week 1): pt will perform bed mobility with supervision assist.   Skilled Therapeutic Interventions/Progress Updates:    Pt up in w/c upon arrival to room. Pt eager to start therapy session. Noted that it was found that the pt has a Lt ulna fx, able to be WBAT in splint per physician notes. Pt propelling w/c with supervision 150 ft, good safety awareness with propulsion and awareness of environment. Walker modified with addition of Lt platform. Transfers: sit/stand supervision once education on hand placement performed. Gait: pt able to ambulate 35 ft X1, 60 ft X1, and 50 ft X1 using platform walker. Pt was consistent with NWB through Rt LE throughout and denies any increased pain in Lt UE. Exercise: seated knee flexion stretch, dorsiflexion stretch, LAQ.  Following session, pt in room in w/c with all needs within reach.   Therapy Documentation Precautions:  Precautions Precautions: Fall Required Braces or Orthoses: Other Brace/Splint Other Brace/Splint: hinged knee brace R Restrictions Weight Bearing Restrictions: Yes RLE Weight Bearing: Non weight bearing  Pain: Rates pain as 6/10 in Rt LE and ribs. Declines needed medication, monitored during session.   See Function Navigator for Current Functional Status.   Therapy/Group: Individual Therapy  [name], PT 01/21/2017, 12:30 PM

## 2017-01-22 ENCOUNTER — Inpatient Hospital Stay (HOSPITAL_COMMUNITY): Payer: BLUE CROSS/BLUE SHIELD | Admitting: Physical Therapy

## 2017-01-22 ENCOUNTER — Inpatient Hospital Stay (HOSPITAL_COMMUNITY): Payer: BLUE CROSS/BLUE SHIELD | Admitting: Occupational Therapy

## 2017-01-22 DIAGNOSIS — S52255D Nondisplaced comminuted fracture of shaft of ulna, left arm, subsequent encounter for closed fracture with routine healing: Secondary | ICD-10-CM

## 2017-01-22 NOTE — Discharge Summary (Signed)
Discharge summary job # [id number]
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
## 2017-01-22 NOTE — Progress Notes (Signed)
Social Work Patient ID: Zachary Berg, male   DOB: 1971/04/09, 46 y.o.   MRN: [id number] Team feels pt is reaching his goals of mod/I and PT to make mod/I in room today. Checked with MD and he is agreeable to Discharge tomorrow. Have ordered equipment and follow up for discharge home tomorrow. Pt very pleased and wife aware also. Work toward discharge tomorrow.

## 2017-01-22 NOTE — Progress Notes (Signed)
Occupational Therapy Session Note  Patient Details  Name: Zachary Berg MRN: [id number] Date of Birth: Feb 28, 1971  Today's Date: 01/22/2017 OT Individual Time: 1030-1200 OT Individual Time Calculation (min): 90 min    Short Term Goals: Week 1:  OT Short Term Goal 1 (Week 1): Pt will complete LB dressing with supervision and use of AE OT Short Term Goal 2 (Week 1): Pt will complete functional shower transfer with supervision while adhering to precautions OT Short Term Goal 3 (Week 1): Pt will ambulate into bathroom with Min A and LRAD for toilet transfer  Skilled Therapeutic Interventions/Progress Updates:    Pt seen this session for self care training of shower, toileting, dressing. Pt is now moving very well with his Platform RW and can get out of bed, stand, ambulate to bathroom with mod I.  For shower transfers, pt did not need any help but it is recommended to have a family member help him at home as he needs set up with covering L arm and R leg with plastic covers.  Once in shower, he bathes with mod I.  He dried off and ambulated to EOB to dress.  Discussed easy set up of equipment/clothes at home.  He states he has plenty of family to assist as needed.  Pt then propelled chair to ADL apt to practice shower stall transfers with tub bench.  It was set up to simulate his home shower.  Pt practiced transfer 2x ensure that he could do it smoothly.  Pt is ready to discharge to home tomorrow as all goals have been met.  Therapy Documentation Precautions:  Precautions Precautions: Fall Required Braces or Orthoses: Other Brace/Splint Other Brace/Splint: hinged knee brace R Restrictions Weight Bearing Restrictions: Yes LUE Weight Bearing: Weight bearing as tolerated RLE Weight Bearing: Non weight bearing Other Position/Activity Restrictions: no ROM restrictions on RLE; brace may be off in bed/resting    Pain: Pain Assessment Pain Assessment: 0-10 Pain Score: 7  Pain Type: Acute  pain Pain Location: Knee Pain Orientation: Right Pain Descriptors / Indicators: Aching Pain Onset: On-going Patients Stated Pain Goal: 4 Pain Intervention(s): Medication (See eMAR) ADL: ADL ADL Comments: Please see functional navigator for ADL status  See Function Navigator for Current Functional Status.   Therapy/Group: Individual Therapy  Sufyaan Palma 01/22/2017, 11:08 AM
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
## 2017-01-22 NOTE — Progress Notes (Signed)
Occupational Therapy Discharge Summary  Patient Details  Name: Zachary Berg MRN: [id number] Date of Birth: [birth year]     Patient has met 9 of 9 long term goals due to improved activity tolerance, improved balance and ability to compensate for deficits.  Patient to discharge at overall Modified Independent level.  Patient's care partner is independent to provide the necessary physical assistance at discharge.    Reasons goals not met: n/a  Recommendation:  No further OT services required at this time.  Equipment: transfer tub bench  Reasons for discharge: treatment goals met  Patient/family agrees with progress made and goals achieved: Yes  OT Discharge Precautions/Restrictions  Precautions Required Braces or Orthoses: Other Brace/Splint Other Brace/Splint: hinged knee brace R Restrictions LUE Weight Bearing: Weight bearing as tolerated RLE Weight Bearing: Non weight bearing Other Position/Activity Restrictions: no ROM restrictions on RLE; brace may be off in bed/resting  ADL ADL ADL Comments: Please see functional navigator for ADL status Vision/Perception  Vision- History Baseline Vision/History: No visual deficits Patient Visual Report: No change from baseline Vision- Assessment Vision Assessment?: No apparent visual deficits  Cognition Overall Cognitive Status: Within Functional Limits for tasks assessed Sensation Sensation Light Touch: Appears Intact Stereognosis: Appears Intact Hot/Cold: Appears Intact Proprioception: Appears Intact Coordination Coordination and Movement Description: LUE in splint, but can use fingers; RUE intact Motor  Motor Motor: Within Functional Limits Mobility    mod I with platform walker Trunk/Postural Assessment  Cervical Assessment Cervical Assessment: Within Functional Limits Thoracic Assessment Thoracic Assessment: Within Functional Limits Lumbar Assessment Lumbar Assessment: Within Functional Limits Postural  Control Postural Control: Within Functional Limits  Balance Dynamic Sitting Balance Dynamic Sitting - Level of Assistance: 7: Independent Dynamic Standing Balance Dynamic Standing - Balance Support: Left upper extremity supported Dynamic Standing - Level of Assistance: 6: Modified independent (Device/Increase time) Extremity/Trunk Assessment RUE Assessment RUE Assessment: Within Functional Limits LUE Assessment LUE Assessment: Within Functional Limits (shoulder WFL, elbow to forearm in splint)   See Function Navigator for Current Functional Status.  Holly 01/22/2017, 1:14 PM

## 2017-01-22 NOTE — Progress Notes (Signed)
Physical Therapy Discharge Summary  Patient Details  Name: Zachary Berg MRN: [id number] Date of Birth: [birth year]  Today's Date: 01/22/2017 PT Individual Time: 8366-2947 PT Individual Time Calculation (min): 43 min    Patient has met 11 of 11 long term goals due to improved activity tolerance, improved balance, improved postural control, increased strength, increased range of motion, decreased pain and ability to compensate for deficits.  Patient to discharge at an ambulatory level Modified Independent-supervision.   Patient's care partner is independent to provide the necessary physical assistance at discharge.  Reasons goals not met: NA  Recommendation:  Patient will benefit from ongoing skilled PT services in home health setting to continue to advance safe functional mobility, address ongoing impairments in standing balance, activity tolerance, ROM, strength, and minimize fall risk.  Equipment: wheelchair, RW  Reasons for discharge: treatment goals met and discharge from hospital  Patient/family agrees with progress made and goals achieved: Yes  PT Discharge Precautions/Restrictions Precautions Required Braces or Orthoses: Other Brace/Splint Other Brace/Splint: hinged knee brace R Restrictions LUE Weight Bearing: Weight bearing as tolerated RLE Weight Bearing: Non weight bearing Other Position/Activity Restrictions: no ROM restrictions on RLE; brace may be off in bed/resting Pain Pain Assessment Pain Assessment: 0-10 Pain Score: 3  Pain Type: Acute pain Pain Location: Knee Pain Orientation: Right Pain Descriptors / Indicators: Aching Pain Onset: On-going Patients Stated Pain Goal: 4 Pain Intervention(s): Medication (See eMAR) Vision/Perception   No change from baseline  Cognition Overall Cognitive Status: Within Functional Limits for tasks assessed Sensation Sensation Light Touch: Appears Intact Stereognosis: Appears Intact Hot/Cold: Appears  Intact Proprioception: Appears Intact Coordination Coordination and Movement Description: LUE in splint, but can use fingers; RUE intact Motor  Motor Motor: Within Functional Limits  Mobility Bed Mobility Bed Mobility: Rolling Right;Sit to Supine;Supine to Sit Rolling Right: 6: Modified independent (Device/Increase time) Supine to Sit: 6: Modified independent (Device/Increase time) Sit to Supine: 6: Modified independent (Device/Increase time) Transfers Transfers: Yes Sit to Stand: 6: Modified independent (Device/Increase time) Stand to Sit: 6: Modified independent (Device/Increase time) Locomotion  Ambulation Ambulation: Yes Ambulation/Gait Assistance: 5: Supervision Ambulation Distance (Feet): 175 Feet Assistive device: Rolling walker Gait Gait: Yes Gait Pattern: Impaired Gait Pattern: Step-to pattern (hop-to ) Stairs / Additional Locomotion Stairs: Yes Stairs Assistance: 5: Supervision Stair Management Technique: With walker Number of Stairs: 2 Height of Stairs: 4 Curb: 5: Psychiatric nurse: Yes Wheelchair Assistance: 6: Modified independent (Device/Increase time) Environmental health practitioner: Both upper extremities Wheelchair Parts Management: Independent Distance: 175 ft  Trunk/Postural Assessment  Cervical Assessment Cervical Assessment: Within Functional Limits Thoracic Assessment Thoracic Assessment: Within Functional Limits Lumbar Assessment Lumbar Assessment: Within Functional Limits Postural Control Postural Control: Within Functional Limits  Balance Dynamic Sitting Balance Dynamic Sitting - Level of Assistance: 7: Independent Dynamic Standing Balance Dynamic Standing - Balance Support: Left upper extremity supported Dynamic Standing - Level of Assistance: 6: Modified independent (Device/Increase time) Extremity Assessment  RUE Assessment RUE Assessment: Within Functional Limits LUE Assessment LUE Assessment: Within  Functional Limits (shoulder WFL, elbow to forearm in splint) RLE Assessment RLE Assessment: Within Functional Limits RLE Strength RLE Overall Strength: Deficits;Due to precautions;Due to pain RLE Overall Strength Comments: at least grossly 3/5 throughout within available ROM, overpressure not applied  LLE Assessment LLE Assessment: Within Functional Limits   See Function Navigator for Current Functional Status.  Kenitha Glendinning, Wells Guiles A 01/22/2017, 1:50 PM

## 2017-01-22 NOTE — Progress Notes (Signed)
Social Work  Discharge Note  The overall goal for the admission was met for:   Discharge location: [place] WIFE IS WORKING  Length of Stay: Yes-5 DAYS  Discharge activity level: Yes-MOD/I LEVEL  Home/community participation: Yes  Services provided included: MD, RD, PT, OT, RN, CM, Pharmacy and SW  Financial Services: Private Insurance: [employer]  Follow-up services arranged: Home Health: [facility], DME: [facility] and Patient/Family has no preference for HH/DME agencies  Comments (or additional information):PT REACHED GOALS QUICKLY MOD/I LEVEL AND WANTED TO [facility]. FAMILY CAN PROVIDE 24 HR CARE AT DISCHARGE. DECLINED ANY SUBSTANCE ABUSE RESOURCES FELT COULD MANAGE ON HIS OWN.   Patient/Family verbalized understanding of follow-up arrangements: Yes  Individual responsible for coordination of the follow-up plan: SELF & KAREN-WIFE  Confirmed correct DME delivered: Elease Hashimoto 01/22/2017    Elease Hashimoto

## 2017-01-22 NOTE — Progress Notes (Signed)
Physical Therapy Session Note  Patient Details  Name: Zachary GroveDavid D Thobe MRN: [id number] Date of Birth: [birth year]  Today's Date: 01/22/2017 PT Individual Time: 0900-0955 PT Individual Time Calculation (min): 55 min   Short Term Goals: Week 1:  PT Short Term Goal 1 (Week 1): pt will perform squat pivot transfer with supervision assist  PT Short Term Goal 2 (Week 1): Pt will propell WC >26600ft with supervisoin assist in crowded environment PT Short Term Goal 3 (Week 1): sit<>stand with supervisoin assist.  PT Short Term Goal 4 (Week 1): Pt will ambulate 3640ft with min assist  PT Short Term Goal 5 (Week 1): pt will perform bed mobility with supervision assist.   Skilled Therapeutic Interventions/Progress Updates:   Treatment 1: Patient in bed, daughter present and demonstrated ace wrapping and donning hinged knee brace to RLE with min cues. Patient sat EOB with HOB raised and use of rail with mod I. Gait training using L PFRW x 175 ft with supervision > mod I. Patient performed bed mobility on flat table with mod I. Supine RLE therex for strengthening/ROM: ankle pumps, quad sets with 5 sec hold, assisted heel slides, assisted SLR, all exercises to fatigue/tolerance. Discussed DC planning, DME needs, and f/u therapies. Patient performed stand pivot transfer using RW back to wheelchair, donned R elevating leg rest, and propelled wheelchair back to room with daughter with mod I.   Therapy Documentation Precautions:  Precautions Precautions: Fall Required Braces or Orthoses: Other Brace/Splint Other Brace/Splint: hinged knee brace R Restrictions Weight Bearing Restrictions: Yes LUE Weight Bearing: Weight bearing as tolerated RLE Weight Bearing: Non weight bearing Other Position/Activity Restrictions: no ROM restrictions on RLE; brace may be off in bed/resting  Pain: Pain Assessment Pain Assessment: 0-10 Pain Score: 8  Faces Pain Scale: No hurt Pain Type: Acute pain Pain Location: Knee Pain  Orientation: Right Pain Descriptors / Indicators: Aching Pain Onset: On-going Pain Intervention(s): Repositioned;Rest (premedicated)   See Function Navigator for Current Functional Status.   Therapy/Group: Individual Therapy  Javel Hersh, Prudencio PairRebecca A 01/22/2017, 9:59 AM
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
## 2017-01-23 DIAGNOSIS — S52255S Nondisplaced comminuted fracture of shaft of ulna, left arm, sequela: Secondary | ICD-10-CM

## 2017-01-23 MED ORDER — METOPROLOL TARTRATE 25 MG PO TABS: 25.0000 mg | ORAL_TABLET | Freq: Two times a day (BID) | ORAL | 0 refills | Status: DC

## 2017-01-23 MED ORDER — FOLIC ACID 1 MG PO TABS: 1.0000 mg | ORAL_TABLET | Freq: Every day | ORAL | 0 refills | Status: DC

## 2017-01-23 MED ORDER — LIDOCAINE 5 % EX PTCH: 1.0000 | MEDICATED_PATCH | CUTANEOUS | 0 refills | Status: DC

## 2017-01-23 MED ORDER — ATORVASTATIN CALCIUM 80 MG PO TABS: 80.0000 mg | ORAL_TABLET | Freq: Every day | ORAL | 0 refills | Status: DC

## 2017-01-23 MED ORDER — OMEPRAZOLE 40 MG PO CPDR: DELAYED_RELEASE_CAPSULE | ORAL | 5 refills | Status: AC

## 2017-01-23 MED ORDER — ADULT MULTIVITAMIN W/MINERALS CH: 1.0000 | ORAL_TABLET | Freq: Every day | ORAL | Status: AC

## 2017-01-23 MED ORDER — OXYCODONE HCL 5 MG PO TABS: 5.0000 mg | ORAL_TABLET | ORAL | 0 refills | Status: AC | PRN

## 2017-01-23 MED FILL — FOLIC ACID 1 MG TABLET: 1 | 30 days supply | Qty: 30 | Fill #0

## 2017-01-23 MED FILL — OMEPRAZOLE DR 40 MG CAPSULE: 40 | 30 days supply | Qty: 30 | Fill #0

## 2017-01-23 MED FILL — oxyCODONE HCL 5 MG TABS: 5 | 3 days supply | Qty: 30 | Fill #0

## 2017-01-23 MED FILL — METOPROLOL TARTRATE 25 MG T: 25 | 30 days supply | Qty: 60 | Fill #0

## 2017-01-23 MED FILL — ATORVASTATIN 80 MG TABLET: 80 | 30 days supply | Qty: 30 | Fill #0
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
## 2017-01-23 NOTE — Progress Notes (Signed)
Pt. Got d/c papers,ready to go home with his wife.

## 2017-01-23 NOTE — Discharge Instructions (Signed)
Inpatient Rehab Discharge Instructions  Zachary GroveDavid D Berg Discharge date and time: No discharge date for patient encounter.   Activities/Precautions/ Functional Status: Activity: non weight bearing right leg with hinged knee brace and weightbearing as tolerated left forearm was splinted Diet: regular diet Wound Care: keep wound clean and dry Functional status:  ___ No restrictions     ___ Walk up steps independently ___ 24/7 supervision/assistance   ___ Walk up steps with assistance ___ Intermittent supervision/assistance  ___ Bathe/dress independently ___ Walk with walker     _x__ Bathe/dress with assistance ___ Walk Independently    ___ Shower independently ___ Walk with assistance    ___ Shower with assistance ___ No alcohol     ___ Return to work/school ________  Special Instructions: NO DRIVING OR SMOKING   COMMUNITY REFERRALS UPON DISCHARGE:    Home Health:   PT & RN  Agency:[facility]  [phone]   Date of last service:01/23/2017  Medical Equipment/Items Ordered:L-PLATFORM Levan HurstROLLING WALKER , [facility]  Agency/Supplier:[facility]  [phone]    My questions have been answered and I understand these instructions. I will adhere to these goals and the provided educational materials after my discharge from the hospital.  Patient/Caregiver Signature _______________________________ Date __________  Clinician Signature _______________________________________ Date __________  Please bring this form and your medication list with you to all your follow-up doctor's appointments.

## 2017-01-29 ENCOUNTER — Telehealth: Payer: Self-pay | Admitting: Physical Medicine & Rehabilitation

## 2017-01-29 ENCOUNTER — Telehealth: Payer: Self-pay | Admitting: Family

## 2017-01-29 ENCOUNTER — Other Ambulatory Visit: Payer: Self-pay

## 2017-01-29 NOTE — Telephone Encounter (Signed)
Please contact pt to arrange hospital follow up with me.  

## 2017-01-29 NOTE — Telephone Encounter (Signed)
Zachary Berg notified that [name] needs call his surgeon for continued pain medication.

## 2017-01-29 NOTE — Telephone Encounter (Signed)
Patient has ran out of his pain medication an would like a refill. Patient doesn't have an appointment schedule until the 12th of April with Dr. Allena Katz  Please advise

## 2017-01-29 NOTE — Telephone Encounter (Signed)
Dione Housekeeper with [facility] needs to know what is patient to do about pain medication.  He only has 2 pain pills left (oxycodone) .  Please call her at [phone].

## 2017-01-29 NOTE — Telephone Encounter (Signed)
I told her that she will need to call his surgeon for further refills on his pain medication.

## 2017-01-30 MED FILL — OXYCODONE/APAP 5/325 MG TAB: 5-325 | 8 days supply | Qty: 70 | Fill #0

## 2017-01-30 NOTE — Telephone Encounter (Signed)
Called pt to schedule. He said that he currently isn't driving so he will check with his spouse schedule and call us back to schedule appt with PCP

## 2017-02-02 NOTE — Telephone Encounter (Signed)
Noted  

## 2017-02-07 ENCOUNTER — Encounter
Payer: BLUE CROSS/BLUE SHIELD | Attending: Physical Medicine & Rehabilitation | Admitting: Physical Medicine & Rehabilitation

## 2017-02-07 ENCOUNTER — Encounter: Payer: Self-pay | Admitting: Physical Medicine & Rehabilitation

## 2017-02-07 DIAGNOSIS — S20212D Contusion of left front wall of thorax, subsequent encounter: Secondary | ICD-10-CM | POA: Diagnosis not present

## 2017-02-07 DIAGNOSIS — S72411D Displaced unspecified condyle fracture of lower end of right femur, subsequent encounter for closed fracture with routine healing: Secondary | ICD-10-CM | POA: Insufficient documentation

## 2017-02-07 DIAGNOSIS — R269 Unspecified abnormalities of gait and mobility: Secondary | ICD-10-CM | POA: Diagnosis not present

## 2017-02-07 DIAGNOSIS — Z833 Family history of diabetes mellitus: Secondary | ICD-10-CM | POA: Insufficient documentation

## 2017-02-07 DIAGNOSIS — R52 Pain, unspecified: Secondary | ICD-10-CM | POA: Insufficient documentation

## 2017-02-07 DIAGNOSIS — S062X2S Diffuse traumatic brain injury with loss of consciousness of 31 minutes to 59 minutes, sequela: Secondary | ICD-10-CM

## 2017-02-07 DIAGNOSIS — Z8711 Personal history of peptic ulcer disease: Secondary | ICD-10-CM | POA: Diagnosis not present

## 2017-02-07 DIAGNOSIS — S069X0D Unspecified intracranial injury without loss of consciousness, subsequent encounter: Secondary | ICD-10-CM | POA: Diagnosis not present

## 2017-02-07 DIAGNOSIS — I2510 Atherosclerotic heart disease of native coronary artery without angina pectoris: Secondary | ICD-10-CM | POA: Insufficient documentation

## 2017-02-07 DIAGNOSIS — S1093XD Contusion of unspecified part of neck, subsequent encounter: Secondary | ICD-10-CM | POA: Diagnosis not present

## 2017-02-07 DIAGNOSIS — Z720 Tobacco use: Secondary | ICD-10-CM

## 2017-02-07 DIAGNOSIS — I214 Non-ST elevation (NSTEMI) myocardial infarction: Secondary | ICD-10-CM

## 2017-02-07 DIAGNOSIS — R419 Unspecified symptoms and signs involving cognitive functions and awareness: Secondary | ICD-10-CM | POA: Diagnosis not present

## 2017-02-07 DIAGNOSIS — Z8349 Family history of other endocrine, nutritional and metabolic diseases: Secondary | ICD-10-CM | POA: Insufficient documentation

## 2017-02-07 DIAGNOSIS — Z8249 Family history of ischemic heart disease and other diseases of the circulatory system: Secondary | ICD-10-CM | POA: Insufficient documentation

## 2017-02-07 DIAGNOSIS — I252 Old myocardial infarction: Secondary | ICD-10-CM | POA: Diagnosis not present

## 2017-02-07 DIAGNOSIS — S82141D Displaced bicondylar fracture of right tibia, subsequent encounter for closed fracture with routine healing: Secondary | ICD-10-CM | POA: Diagnosis not present

## 2017-02-07 DIAGNOSIS — S8251XD Displaced fracture of medial malleolus of right tibia, subsequent encounter for closed fracture with routine healing: Secondary | ICD-10-CM | POA: Insufficient documentation

## 2017-02-07 DIAGNOSIS — K219 Gastro-esophageal reflux disease without esophagitis: Secondary | ICD-10-CM | POA: Diagnosis not present

## 2017-02-07 DIAGNOSIS — S8254XS Nondisplaced fracture of medial malleolus of right tibia, sequela: Secondary | ICD-10-CM
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
## 2017-02-13 ENCOUNTER — Encounter: Payer: Self-pay | Admitting: Cardiology

## 2017-02-13 ENCOUNTER — Ambulatory Visit (INDEPENDENT_AMBULATORY_CARE_PROVIDER_SITE_OTHER): Payer: BLUE CROSS/BLUE SHIELD | Admitting: Cardiology

## 2017-02-13 VITALS — BP 112/76 | HR 55 | Wt 153.0 lb

## 2017-02-13 DIAGNOSIS — F1220 Cannabis dependence, uncomplicated: Secondary | ICD-10-CM

## 2017-02-13 DIAGNOSIS — I2510 Atherosclerotic heart disease of native coronary artery without angina pectoris: Secondary | ICD-10-CM | POA: Insufficient documentation

## 2017-02-13 DIAGNOSIS — K219 Gastro-esophageal reflux disease without esophagitis: Secondary | ICD-10-CM | POA: Diagnosis not present

## 2017-02-13 DIAGNOSIS — E785 Hyperlipidemia, unspecified: Secondary | ICD-10-CM | POA: Diagnosis not present

## 2017-02-13 DIAGNOSIS — G8929 Other chronic pain: Secondary | ICD-10-CM | POA: Diagnosis not present

## 2017-02-13 DIAGNOSIS — D62 Acute posthemorrhagic anemia: Secondary | ICD-10-CM

## 2017-02-13 DIAGNOSIS — F1010 Alcohol abuse, uncomplicated: Secondary | ICD-10-CM

## 2017-02-13 DIAGNOSIS — I10 Essential (primary) hypertension: Secondary | ICD-10-CM | POA: Diagnosis not present

## 2017-02-13 DIAGNOSIS — S8251XD Displaced fracture of medial malleolus of right tibia, subsequent encounter for closed fracture with routine healing: Secondary | ICD-10-CM | POA: Diagnosis not present

## 2017-02-13 DIAGNOSIS — S062X2S Diffuse traumatic brain injury with loss of consciousness of 31 minutes to 59 minutes, sequela: Secondary | ICD-10-CM | POA: Diagnosis not present

## 2017-02-13 DIAGNOSIS — S52252D Displaced comminuted fracture of shaft of ulna, left arm, subsequent encounter for closed fracture with routine healing: Secondary | ICD-10-CM

## 2017-02-13 DIAGNOSIS — I2109 ST elevation (STEMI) myocardial infarction involving other coronary artery of anterior wall: Secondary | ICD-10-CM

## 2017-02-13 MED ORDER — ATORVASTATIN CALCIUM 40 MG PO TABS: 40.0000 mg | ORAL_TABLET | Freq: Every day | ORAL | 6 refills | Status: AC

## 2017-02-13 MED FILL — ATORVASTATIN 40 MG TABLET: 40 | 30 days supply | Qty: 30 | Fill #0

## 2017-02-13 NOTE — Patient Instructions (Signed)
Medication Instructions:   START  LIPITOR 40 MG ONCE A  DAY    If you need a refill on your cardiac medications before your next appointment, please call your pharmacy.  Labwork: RETURN FOR FASTING  LIPID AND LIVER  I 6 TO 8 WEEKS    Testing/Procedures: NONE ORDERED  TODAY    Follow-Up: WITH DR Eldridge Dace 6 TO 8 WEEKS    Any Other Special Instructions Will Be Listed Below (If Applicable).
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
## 2017-02-18 MED FILL — OXYCODONE/APAP 5/325 MG TAB: 5-325 | 10 days supply | Qty: 60 | Fill #0

## 2017-02-22 ENCOUNTER — Telehealth: Payer: Self-pay | Admitting: Interventional Cardiology

## 2017-02-22 MED FILL — OMEPRAZOLE DR 40 MG CAPSULE: 40 | 30 days supply | Qty: 30 | Fill #1

## 2017-02-22 NOTE — Telephone Encounter (Signed)
Pt calling requesting a refill on folic acid and metoprolol, that pt was started on in the hospital. Would you like to refill these medications? Please advise

## 2017-02-22 NOTE — Telephone Encounter (Signed)
[place]      *STAT* If patient is at the pharmacy, call can be transferred to refill team.   1. Which medications need to be refilled? (please list name of each medication and dose if known)  metoprolol tartrate (LOPRESSOR) 25 MG tablet Take 1 tablet (25 mg total) by mouth 2 (two) times daily   folic acid (FOLVITE) 1 MG tablet Take 1 tablet (1 mg total) by mouth daily     2. Which pharmacy/location (including street and city if local pharmacy) is medication to be sent to? [facility] in [place]  3. Do they need a 30 day or 90 day supply? 30

## 2017-02-26 MED ORDER — METOPROLOL TARTRATE 25 MG PO TABS
25.0000 mg | ORAL_TABLET | Freq: Two times a day (BID) | ORAL | 3 refills | Status: AC
Start: 2017-02-26 — End: ?

## 2017-02-26 MED ORDER — FOLIC ACID 1 MG PO TABS: 1.0000 mg | ORAL_TABLET | Freq: Every day | ORAL | 3 refills | Status: AC

## 2017-02-26 MED FILL — METOPROLOL TARTRATE 25 MG T: 25 | 90 days supply | Qty: 180 | Fill #0

## 2017-02-26 MED FILL — FOLIC ACID 1 MG TABLET: 1 | 90 days supply | Qty: 90 | Fill #0

## 2017-02-26 NOTE — Telephone Encounter (Signed)
Pt's medications was sent to pt's pharmacy as requested. Confirmation received.  

## 2017-02-26 NOTE — Telephone Encounter (Signed)
Okay to refill medications? 

## 2017-03-04 ENCOUNTER — Encounter: Payer: Self-pay | Admitting: *Deleted

## 2017-03-13 MED FILL — OXYCODONE/APAP 5/325 MG TAB: 5-325 | 15 days supply | Qty: 60 | Fill #0
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
## 2017-03-20 ENCOUNTER — Encounter: Payer: Self-pay | Admitting: Interventional Cardiology

## 2017-03-20 ENCOUNTER — Encounter
Payer: BLUE CROSS/BLUE SHIELD | Attending: Physical Medicine & Rehabilitation | Admitting: Physical Medicine & Rehabilitation

## 2017-03-20 ENCOUNTER — Ambulatory Visit (INDEPENDENT_AMBULATORY_CARE_PROVIDER_SITE_OTHER): Payer: BLUE CROSS/BLUE SHIELD | Admitting: Interventional Cardiology

## 2017-03-20 ENCOUNTER — Encounter: Payer: Self-pay | Admitting: Physical Medicine & Rehabilitation

## 2017-03-20 ENCOUNTER — Other Ambulatory Visit: Payer: BLUE CROSS/BLUE SHIELD | Admitting: *Deleted

## 2017-03-20 VITALS — BP 92/80 | HR 58 | Ht 69.0 in | Wt 160.1 lb

## 2017-03-20 DIAGNOSIS — R269 Unspecified abnormalities of gait and mobility: Secondary | ICD-10-CM | POA: Diagnosis not present

## 2017-03-20 DIAGNOSIS — Z8711 Personal history of peptic ulcer disease: Secondary | ICD-10-CM | POA: Diagnosis not present

## 2017-03-20 DIAGNOSIS — Z8349 Family history of other endocrine, nutritional and metabolic diseases: Secondary | ICD-10-CM | POA: Diagnosis not present

## 2017-03-20 DIAGNOSIS — I252 Old myocardial infarction: Secondary | ICD-10-CM

## 2017-03-20 DIAGNOSIS — S8251XD Displaced fracture of medial malleolus of right tibia, subsequent encounter for closed fracture with routine healing: Secondary | ICD-10-CM | POA: Diagnosis not present

## 2017-03-20 DIAGNOSIS — S062X2S Diffuse traumatic brain injury with loss of consciousness of 31 minutes to 59 minutes, sequela: Secondary | ICD-10-CM | POA: Diagnosis not present

## 2017-03-20 DIAGNOSIS — Z8249 Family history of ischemic heart disease and other diseases of the circulatory system: Secondary | ICD-10-CM | POA: Insufficient documentation

## 2017-03-20 DIAGNOSIS — K219 Gastro-esophageal reflux disease without esophagitis: Secondary | ICD-10-CM | POA: Insufficient documentation

## 2017-03-20 DIAGNOSIS — I2510 Atherosclerotic heart disease of native coronary artery without angina pectoris: Secondary | ICD-10-CM | POA: Insufficient documentation

## 2017-03-20 DIAGNOSIS — S82141D Displaced bicondylar fracture of right tibia, subsequent encounter for closed fracture with routine healing: Secondary | ICD-10-CM | POA: Diagnosis not present

## 2017-03-20 DIAGNOSIS — S72411D Displaced unspecified condyle fracture of lower end of right femur, subsequent encounter for closed fracture with routine healing: Secondary | ICD-10-CM | POA: Insufficient documentation

## 2017-03-20 DIAGNOSIS — Z720 Tobacco use: Secondary | ICD-10-CM

## 2017-03-20 DIAGNOSIS — I25119 Atherosclerotic heart disease of native coronary artery with unspecified angina pectoris: Secondary | ICD-10-CM | POA: Diagnosis not present

## 2017-03-20 DIAGNOSIS — R419 Unspecified symptoms and signs involving cognitive functions and awareness: Secondary | ICD-10-CM | POA: Diagnosis not present

## 2017-03-20 DIAGNOSIS — Z833 Family history of diabetes mellitus: Secondary | ICD-10-CM | POA: Insufficient documentation

## 2017-03-20 DIAGNOSIS — S8254XS Nondisplaced fracture of medial malleolus of right tibia, sequela: Secondary | ICD-10-CM | POA: Diagnosis not present

## 2017-03-20 DIAGNOSIS — S1093XD Contusion of unspecified part of neck, subsequent encounter: Secondary | ICD-10-CM | POA: Diagnosis not present

## 2017-03-20 DIAGNOSIS — R52 Pain, unspecified: Secondary | ICD-10-CM | POA: Diagnosis not present

## 2017-03-20 DIAGNOSIS — S069X0D Unspecified intracranial injury without loss of consciousness, subsequent encounter: Secondary | ICD-10-CM | POA: Insufficient documentation

## 2017-03-20 DIAGNOSIS — E785 Hyperlipidemia, unspecified: Secondary | ICD-10-CM

## 2017-03-20 DIAGNOSIS — S20212D Contusion of left front wall of thorax, subsequent encounter: Secondary | ICD-10-CM | POA: Diagnosis not present

## 2017-03-20 DIAGNOSIS — I469 Cardiac arrest, cause unspecified: Secondary | ICD-10-CM | POA: Diagnosis not present

## 2017-03-20 LAB — HEPATIC FUNCTION PANEL
ALBUMIN: 4.2 g/dL (ref 3.5–5.5)
ALT: 30 IU/L (ref 0–44)
AST: 15 IU/L (ref 0–40)
Alkaline Phosphatase: 133 IU/L — ABNORMAL HIGH (ref 39–117)
BILIRUBIN TOTAL: 0.5 mg/dL (ref 0.0–1.2)
Bilirubin, Direct: 0.14 mg/dL (ref 0.00–0.40)
TOTAL PROTEIN: 6.5 g/dL (ref 6.0–8.5)
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
## 2017-03-20 NOTE — Patient Instructions (Signed)
Medication Instructions:  Your physician recommends that you continue on your current medications as directed. Please refer to the Current Medication list given to you today.   Labwork: None ordered  Testing/Procedures: None ordered  Follow-Up: Your physician recommends that you schedule a follow-up appointment in: 4 months with Dr. Varanasi.    Any Other Special Instructions Will Be Listed Below (If Applicable).     If you need a refill on your cardiac medications before your next appointment, please call your pharmacy.   

## 2017-03-29 ENCOUNTER — Encounter: Payer: Self-pay | Admitting: *Deleted

## 2017-03-29 ENCOUNTER — Telehealth: Payer: Self-pay | Admitting: Cardiology

## 2017-03-29 NOTE — Telephone Encounter (Signed)
New message ° ° ° ° °Returning a call to the nurse to get lab results °

## 2017-03-29 NOTE — Telephone Encounter (Signed)
Returned pts call.  He has been made aware of his LFT results and he verbalized understanding.

## 2017-04-04 ENCOUNTER — Telehealth (HOSPITAL_COMMUNITY): Payer: Self-pay

## 2017-04-04 NOTE — Telephone Encounter (Signed)
Verified [employer] insurance benefits through [employer] No Copay, 30% Coinsurance, Deductible $3000.00, pt has met $3000.00 Out of Pocket $7150.00, pt has met $7150.00.  Reference [id number].... KJ

## 2017-04-24 MED FILL — OMEPRAZOLE DR 40 MG CAPSULE: 40 | 30 days supply | Qty: 30 | Fill #2

## 2017-04-24 MED FILL — ATORVASTATIN 40 MG TABLET: 40 | 30 days supply | Qty: 30 | Fill #1

## 2017-04-25 MED FILL — OXYCOD/ACETAMINOPHEN 5-325M: 5-325 | 13 days supply | Qty: 40 | Fill #0

## 2017-05-17 MED FILL — OXYCODONE-ACETAMINOPHEN 5-3: 5-325 | 10 days supply | Qty: 30 | Fill #0

## 2017-06-12 MED FILL — traMADol HCL 50 MG TABS: 50 | 20 days supply | Qty: 40 | Fill #0

## 2017-06-14 MED FILL — ATORVASTATIN 40 MG TABLET: 40 | 30 days supply | Qty: 30 | Fill #2

## 2017-06-14 MED FILL — METOPROLOL TARTRATE 25 MG T: 25 | 90 days supply | Qty: 180 | Fill #1

## 2017-06-14 MED FILL — FOLIC ACID 1 MG TABLET: 1 | 90 days supply | Qty: 90 | Fill #1

## 2017-06-20 ENCOUNTER — Ambulatory Visit: Payer: BLUE CROSS/BLUE SHIELD | Admitting: Physical Medicine & Rehabilitation

## 2017-06-26 ENCOUNTER — Encounter: Payer: BLUE CROSS/BLUE SHIELD | Admitting: Physical Medicine & Rehabilitation

## 2017-07-24 MED FILL — ATORVASTATIN 40 MG TABLET: 40 | 30 days supply | Qty: 30 | Fill #3
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
## 2017-07-25 ENCOUNTER — Encounter: Payer: Self-pay | Admitting: Interventional Cardiology

## 2017-07-25 ENCOUNTER — Encounter (INDEPENDENT_AMBULATORY_CARE_PROVIDER_SITE_OTHER): Payer: Self-pay

## 2017-07-25 ENCOUNTER — Ambulatory Visit (INDEPENDENT_AMBULATORY_CARE_PROVIDER_SITE_OTHER): Payer: BLUE CROSS/BLUE SHIELD | Admitting: Interventional Cardiology

## 2017-07-25 VITALS — BP 122/70 | HR 51 | Ht 69.0 in | Wt 159.4 lb

## 2017-07-25 DIAGNOSIS — R931 Abnormal findings on diagnostic imaging of heart and coronary circulation: Secondary | ICD-10-CM | POA: Insufficient documentation

## 2017-07-25 DIAGNOSIS — I252 Old myocardial infarction: Secondary | ICD-10-CM

## 2017-07-25 DIAGNOSIS — I25119 Atherosclerotic heart disease of native coronary artery with unspecified angina pectoris: Secondary | ICD-10-CM | POA: Diagnosis not present

## 2017-07-25 DIAGNOSIS — E785 Hyperlipidemia, unspecified: Secondary | ICD-10-CM

## 2017-07-25 DIAGNOSIS — I469 Cardiac arrest, cause unspecified: Secondary | ICD-10-CM

## 2017-07-25 NOTE — Patient Instructions (Signed)
Medication Instructions:  Your physician recommends that you continue on your current medications as directed. Please refer to the Current Medication list given to you today.   Labwork: Your physician recommends that you return for FASTING lab work for LIPIDS and LFTS   Testing/Procedures: None ordered  Follow-Up: Your physician wants you to follow-up in: 1 year with Dr. Varanasi. You will receive a reminder letter in the mail two months in advance. If you don't receive a letter, please call our office to schedule the follow-up appointment.   Any Other Special Instructions Will Be Listed Below (If Applicable).     If you need a refill on your cardiac medications before your next appointment, please call your pharmacy.   

## 2017-07-31 ENCOUNTER — Other Ambulatory Visit: Payer: BLUE CROSS/BLUE SHIELD

## 2017-07-31 DIAGNOSIS — E785 Hyperlipidemia, unspecified: Secondary | ICD-10-CM

## 2017-07-31 LAB — HEPATIC FUNCTION PANEL
ALK PHOS: 116 IU/L (ref 39–117)
ALT: 17 IU/L (ref 0–44)
AST: 13 IU/L (ref 0–40)
Albumin: 4.2 g/dL (ref 3.5–5.5)
BILIRUBIN TOTAL: 0.5 mg/dL (ref 0.0–1.2)
BILIRUBIN, DIRECT: 0.15 mg/dL (ref 0.00–0.40)
Total Protein: 6.3 g/dL (ref 6.0–8.5)

## 2017-07-31 LAB — LIPID PANEL
Chol/HDL Ratio: 2.8 ratio (ref 0.0–5.0)
Cholesterol, Total: 113 mg/dL (ref 100–199)
HDL: 40 mg/dL (ref >39)
LDL Calculated: 58 mg/dL (ref 0–99)
Triglycerides: 75 mg/dL (ref 0–149)
VLDL Cholesterol Cal: 15 mg/dL (ref 5–40)

## 2017-08-01 MED FILL — OXYCODONE-ACETAMINOPHEN 5-3: 5-325 | 5 days supply | Qty: 10 | Fill #0

## 2017-08-08 MED FILL — OXYCODONE-ACETAMINOPHEN 5-3: 5-325 | 21 days supply | Qty: 42 | Fill #0

## 2017-08-29 MED FILL — OXYCODONE-ACETAMINOPHEN 5-3: 5-325 | 21 days supply | Qty: 42 | Fill #0

## 2017-09-27 MED FILL — OXYCODONE-ACETAMINOPHEN 5-3: 5-325 | 21 days supply | Qty: 42 | Fill #0

## 2017-10-25 MED FILL — ATORVASTATIN 40 MG TABLET: 40 | 30 days supply | Qty: 30 | Fill #4

## 2017-10-25 MED FILL — FOLIC ACID 1 MG TABLET: 1 | 90 days supply | Qty: 90 | Fill #2

## 2017-10-25 MED FILL — METOPROLOL TARTRATE 25 MG T: 25 | 90 days supply | Qty: 180 | Fill #2

## 2017-10-25 MED FILL — OXYCODONE-ACETAMINOPHEN 5-3: 5-325 | 30 days supply | Qty: 60 | Fill #0

## 2017-11-22 MED FILL — OXYCODONE-ACETAMINOPHEN 5-3: 5-325 | 30 days supply | Qty: 60 | Fill #0

## 2017-12-23 MED FILL — OXYCODONE-ACETAMINOPHEN 5-3: 5-325 | 30 days supply | Qty: 60 | Fill #0

## 2018-01-20 MED FILL — OXYCODONE-ACETAMINOPHEN 5-3: 5-325 | 30 days supply | Qty: 60 | Fill #0

## 2018-01-22 MED FILL — METOPROLOL TARTRATE 25 MG T: 25 | 90 days supply | Qty: 180 | Fill #3

## 2018-01-22 MED FILL — FOLIC ACID 1 MG TABS: 1 | 90 days supply | Qty: 90 | Fill #3

## 2018-01-24 ENCOUNTER — Telehealth: Payer: Self-pay | Admitting: Interventional Cardiology
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
## 2018-02-20 MED FILL — OXYCODONE-ACETAMINOPHEN 5-3: 5-325 | 30 days supply | Qty: 60 | Fill #0

## 2018-03-12 MED FILL — CHANTIX STARTING MONTH BOX: 0.5 MG X 11 | 30 days supply | Qty: 53 | Fill #0
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
## 2018-03-31 MED FILL — OXYCODONE-ACETAMINOPHEN 5-3: 5-325 | 30 days supply | Qty: 60 | Fill #0

## 2018-04-28 MED FILL — HYDROCODON-APAP 5-325: 5-325 | 30 days supply | Qty: 60 | Fill #0

## 2018-05-27 MED FILL — OXYCODONE-ACETAMINOPHEN 5-3: 5-325 | 30 days supply | Qty: 60 | Fill #0

## 2018-06-27 MED FILL — OXYCODONE-ACETAMINOPHEN 5-3: 5-325 | 30 days supply | Qty: 60 | Fill #0

## 2018-07-28 MED FILL — CLINDAMYCIN HCL 300 MG CAP: 300 | 7 days supply | Qty: 21 | Fill #0

## 2018-07-28 MED FILL — OXYCODONE-ACETAMINOPHEN 5-3: 5-325 | 30 days supply | Qty: 60 | Fill #0

## 2018-07-28 MED FILL — CHANTIX STARTING MONTH BOX: 0.5 MG X 11 | 28 days supply | Qty: 53 | Fill #0

## 2018-08-27 MED FILL — OMEPRAZOLE 40 MG CPDR: 40 | 90 days supply | Qty: 90 | Fill #0

## 2018-08-28 MED FILL — OXYCODONE-ACETAMINOPHEN 5-3: 5-325 | 30 days supply | Qty: 60 | Fill #0

## 2018-09-22 MED FILL — NAPROXEN 500 MG TABLET: 500 | 7 days supply | Qty: 14 | Fill #0

## 2018-11-27 MED FILL — OMEPRAZOLE 40 MG CPDR: 40 | 90 days supply | Qty: 90 | Fill #0

## 2018-11-27 MED FILL — OXYCODONE-ACETAMINOPHEN 5-3: 5-325 | 30 days supply | Qty: 60 | Fill #0

## 2018-12-22 MED FILL — SM CLEARLAX POWDER: 17 | 15 days supply | Qty: 510 | Fill #0

## 2018-12-22 MED FILL — SENNA PLUS 8.6-50 MG TABS: 8.6-50 | 50 days supply | Qty: 100 | Fill #0

## 2018-12-22 MED FILL — GABAPENTIN 300 MG CAPSULE: 300 | 30 days supply | Qty: 90 | Fill #0

## 2018-12-22 MED FILL — oxyCODONE HCL 10 MG TABS: 10 | 27 days supply | Qty: 160 | Fill #0

## 2018-12-22 MED FILL — ONDANSETRON HCL 4 MG TABLET: 4 | 7 days supply | Qty: 20 | Fill #0

## 2018-12-22 MED FILL — PANTOPRAZOLE SOD DR 40 MG T: 40 | 30 days supply | Qty: 30 | Fill #0

## 2018-12-23 MED FILL — levoFLOXacin 750 MG TABS: 750 | 7 days supply | Qty: 7 | Fill #0

## 2018-12-26 MED FILL — ELIQUIS 5 MG TABLET: 5 | 29 days supply | Qty: 74 | Fill #0

## 2018-12-26 MED FILL — fentaNYL 50 MCG/HR PT72: 50 | 15 days supply | Qty: 5 | Fill #0

## 2018-12-30 MED FILL — ONDANSETRON HCL 8 MG TABLET: 8 | 20 days supply | Qty: 60 | Fill #0

## 2019-01-05 MED FILL — fentaNYL 25 MCG/HR PT72: 25 | 15 days supply | Qty: 5 | Fill #0

## 2019-01-06 ENCOUNTER — Other Ambulatory Visit: Payer: Self-pay | Admitting: Interventional Radiology

## 2019-01-06 DIAGNOSIS — S22040A Wedge compression fracture of fourth thoracic vertebra, initial encounter for closed fracture: Secondary | ICD-10-CM

## 2019-01-07 MED FILL — fentaNYL 75 MCG/HR PT72: 75 | 15 days supply | Qty: 5 | Fill #0

## 2019-01-07 MED FILL — HYDROmorphone HCL 2 MG TABS: 2 | 30 days supply | Qty: 90 | Fill #0

## 2019-01-14 MED FILL — MAGNESIUM OXIDE 400 MG TAB: 400 (240 MG | 60 days supply | Qty: 120 | Fill #0

## 2019-01-14 MED FILL — POTASSIUM CL ER 10 MEQ CAP: 10 | 14 days supply | Qty: 28 | Fill #0

## 2019-01-26 MED FILL — GABAPENTIN 300 MG CAPSULE: 300 | 30 days supply | Qty: 90 | Fill #1

## 2019-01-26 MED FILL — fentaNYL 75 MCG/HR PT72: 75 | 15 days supply | Qty: 5 | Fill #0

## 2019-01-26 MED FILL — PANTOPRAZOLE SOD DR 40 MG T: 40 | 30 days supply | Qty: 30 | Fill #1

## 2019-01-28 MED FILL — POTASSIUM CL ER 10 MEQ CAP: 10 | 14 days supply | Qty: 28 | Fill #0

## 2019-01-28 MED FILL — PROCHLORPERAZINE 10 MG TAB: 10 | 23 days supply | Qty: 90 | Fill #0

## 2019-01-28 MED FILL — ONDANSETRON HCL 8 MG TABLET: 8 | 20 days supply | Qty: 60 | Fill #0

## 2019-01-28 MED FILL — ELIQUIS STARTER PACK 5 MG T: 5 | 28 days supply | Qty: 74 | Fill #0

## 2019-02-05 MED FILL — fentaNYL 100 MCG/HR PT72: 100 | 30 days supply | Qty: 10 | Fill #0

## 2019-02-18 MED FILL — oxyCODONE HCL 10 MG TABS: 10 | 22 days supply | Qty: 90 | Fill #0

## 2019-02-18 MED FILL — HYDROmorphone HCL 2 MG TABS: 2 | 20 days supply | Qty: 60 | Fill #0

## 2019-02-19 MED FILL — ONDANSETRON HCL 8 MG TABLET: 8 | 20 days supply | Qty: 60 | Fill #1

## 2019-02-19 MED FILL — PROCHLORPERAZINE 10 MG TAB: 10 | 23 days supply | Qty: 90 | Fill #1

## 2019-02-19 MED FILL — GABAPENTIN 300 MG CAPSULE: 300 | 30 days supply | Qty: 90 | Fill #2

## 2019-02-27 MED FILL — PANTOPRAZOLE SOD DR 40 MG T: 40 | 30 days supply | Qty: 30 | Fill #2

## 2019-03-10 MED FILL — fentaNYL 100 MCG/HR PT72: 100 | 30 days supply | Qty: 10 | Fill #0

## 2019-03-10 MED FILL — HYDROmorphone HCL 2 MG TABS: 2 | 20 days supply | Qty: 60 | Fill #0

## 2019-03-10 MED FILL — oxyCODONE HCL 10 MG TABS: 10 | 23 days supply | Qty: 90 | Fill #0

## 2019-03-11 MED FILL — MAGNESIUM OXIDE 400 MG TAB: 400 (240 MG | 60 days supply | Qty: 120 | Fill #1

## 2019-03-11 MED FILL — ONDANSETRON HCL 8 MG TABLET: 8 | 20 days supply | Qty: 60 | Fill #2

## 2019-03-11 MED FILL — PROCHLORPERAZINE 10 MG TAB: 10 | 23 days supply | Qty: 90 | Fill #2

## 2019-03-16 MED FILL — POTASSIUM CHLORIDE CRYS ER: 20 | 5 days supply | Qty: 20 | Fill #0

## 2019-03-19 MED FILL — fentaNYL 50 MCG/HR PT72: 50 | 30 days supply | Qty: 10 | Fill #0

## 2019-03-30 MED FILL — levoFLOXacin 500 MG TABS: 500 | 5 days supply | Qty: 5 | Fill #0

## 2019-03-31 MED FILL — ONDANSETRON HCL 8 MG TABLET: 8 | 20 days supply | Qty: 60 | Fill #3

## 2019-03-31 MED FILL — PROCHLORPERAZINE 10 MG TAB: 10 | 23 days supply | Qty: 90 | Fill #3

## 2019-04-01 MED FILL — oxyCODONE HCL 10 MG TABS: 10 | 22 days supply | Qty: 90 | Fill #0

## 2019-04-01 MED FILL — HYDROmorphone HCL 2 MG TABS: 2 | 20 days supply | Qty: 60 | Fill #0

## 2019-04-17 MED FILL — GABAPENTIN 300 MG CAPSULE: 300 | 15 days supply | Qty: 45 | Fill #0

## 2019-04-17 MED FILL — BENZONATATE 100 MG CAPS: 100 | 10 days supply | Qty: 30 | Fill #0

## 2019-04-17 MED FILL — CARVEDILOL 12.5 MG TABLET: 12.5 | 30 days supply | Qty: 60 | Fill #0

## 2019-04-17 MED FILL — STIMULANT LAXATIVE 8.6-50 M: 8.6-50 | 50 days supply | Qty: 100 | Fill #0

## 2019-04-17 MED FILL — POTASSIUM CHLORIDE CRYS ER: 20 | 30 days supply | Qty: 60 | Fill #0

## 2019-04-17 MED FILL — MAGNESIUM OXIDE 400 MG TAB: 400 (240 MG | 30 days supply | Qty: 120 | Fill #0

## 2019-04-20 MED FILL — PANTOPRAZOLE SOD DR 40 MG T: 40 | 30 days supply | Qty: 30 | Fill #0

## 2019-04-21 MED FILL — DEXAMETHASONE 4 MG TABLET: 4 | 30 days supply | Qty: 30 | Fill #0

## 2019-04-23 MED FILL — HYDROmorphone HCL 2 MG TABS: 2 | 11 days supply | Qty: 90 | Fill #0

## 2019-04-24 MED FILL — fentaNYL 25 MCG/HR PT72: 25 | 15 days supply | Qty: 5 | Fill #0

## 2019-04-28 MED FILL — LORazepam 0.5 MG TABS: 0.5 | 3 days supply | Qty: 15 | Fill #0

## 2019-04-29 DEATH — deceased
